# Patient Record
Sex: Male | Born: 1955 | Race: Black or African American | Hispanic: No | Smoking: Current every day smoker
Health system: Southern US, Community
[De-identification: ages and names within clinical notes are randomized; demographics above are authoritative.]

## PROBLEM LIST (undated history)

## (undated) DIAGNOSIS — E559 Vitamin D deficiency, unspecified: Secondary | ICD-10-CM

## (undated) DIAGNOSIS — K219 Gastro-esophageal reflux disease without esophagitis: Secondary | ICD-10-CM

## (undated) DIAGNOSIS — I700 Atherosclerosis of aorta: Secondary | ICD-10-CM

## (undated) DIAGNOSIS — J40 Bronchitis, not specified as acute or chronic: Secondary | ICD-10-CM

## (undated) DIAGNOSIS — E785 Hyperlipidemia, unspecified: Secondary | ICD-10-CM

## (undated) DIAGNOSIS — T7840XA Allergy, unspecified, initial encounter: Secondary | ICD-10-CM

## (undated) DIAGNOSIS — I10 Essential (primary) hypertension: Secondary | ICD-10-CM

## (undated) DIAGNOSIS — N400 Enlarged prostate without lower urinary tract symptoms: Secondary | ICD-10-CM

## (undated) HISTORY — DX: Hypercalcemia: E83.52

## (undated) HISTORY — DX: Benign prostatic hyperplasia without lower urinary tract symptoms: N40.0

## (undated) HISTORY — DX: Bronchitis, not specified as acute or chronic: J40

## (undated) HISTORY — DX: Vitamin D deficiency, unspecified: E55.9

## (undated) HISTORY — DX: Allergy, unspecified, initial encounter: T78.40XA

## (undated) HISTORY — DX: Hyperlipidemia, unspecified: E78.5

## (undated) HISTORY — DX: Essential (primary) hypertension: I10

## (undated) HISTORY — DX: Atherosclerosis of aorta: I70.0

## (undated) HISTORY — DX: Gastro-esophageal reflux disease without esophagitis: K21.9

---

## 2004-11-03 ENCOUNTER — Emergency Department (HOSPITAL_COMMUNITY): Admission: EM | Admit: 2004-11-03 | Discharge: 2004-11-03 | Payer: Self-pay | Admitting: Family Medicine

## 2006-01-23 ENCOUNTER — Emergency Department (HOSPITAL_COMMUNITY): Admission: EM | Admit: 2006-01-23 | Discharge: 2006-01-23 | Payer: Self-pay | Admitting: Emergency Medicine

## 2008-08-25 ENCOUNTER — Encounter: Admission: RE | Admit: 2008-08-25 | Discharge: 2008-08-25 | Payer: Self-pay | Admitting: Family Medicine

## 2010-04-10 ENCOUNTER — Emergency Department (HOSPITAL_COMMUNITY): Admission: EM | Admit: 2010-04-10 | Discharge: 2010-04-10 | Payer: Self-pay | Admitting: Family Medicine

## 2011-11-13 ENCOUNTER — Ambulatory Visit (HOSPITAL_COMMUNITY)
Admission: RE | Admit: 2011-11-13 | Discharge: 2011-11-13 | Disposition: A | Discharge status: Home / Self Care | Payer: 59 | Source: Ambulatory Visit | Attending: Internal Medicine | Admitting: Internal Medicine

## 2011-11-13 ENCOUNTER — Other Ambulatory Visit: Payer: Self-pay | Admitting: Internal Medicine

## 2011-11-13 DIAGNOSIS — R059 Cough, unspecified: Secondary | ICD-10-CM | POA: Insufficient documentation

## 2011-11-13 DIAGNOSIS — J40 Bronchitis, not specified as acute or chronic: Secondary | ICD-10-CM

## 2011-11-13 DIAGNOSIS — R05 Cough: Secondary | ICD-10-CM | POA: Insufficient documentation

## 2014-11-20 ENCOUNTER — Emergency Department (HOSPITAL_COMMUNITY)
Admission: EM | Admit: 2014-11-20 | Discharge: 2014-11-20 | Disposition: A | Discharge status: Home / Self Care | Payer: 59 | Source: Home / Self Care | Attending: Emergency Medicine | Admitting: Emergency Medicine

## 2014-11-20 ENCOUNTER — Encounter (HOSPITAL_COMMUNITY): Payer: Self-pay | Admitting: Emergency Medicine

## 2014-11-20 DIAGNOSIS — J209 Acute bronchitis, unspecified: Secondary | ICD-10-CM

## 2014-11-20 MED ORDER — AZITHROMYCIN 250 MG PO TABS: ORAL_TABLET | ORAL | Status: DC

## 2014-11-20 MED ORDER — CETIRIZINE HCL 10 MG PO TABS
10.0000 mg | ORAL_TABLET | Freq: Every day | ORAL | Status: DC
Start: 2014-11-20 — End: 2022-02-25

## 2014-11-20 MED ORDER — PREDNISONE 50 MG PO TABS: ORAL_TABLET | ORAL | Status: DC

## 2014-11-20 NOTE — Discharge Instructions (Signed)
You have bronchitis. Take prednisone and azithromycin as prescribed. Use Zyrtec daily for the next week or so. If you start the medications today, you will be able to enjoy a beer with the game on Saturday. Follow-up as needed.

## 2014-11-20 NOTE — ED Provider Notes (Signed)
CSN: [id number]     Arrival date & time 11/20/14  1027 History   First MD Initiated Contact with Patient 11/20/14 1117     Chief Complaint  Patient presents with  . Cough   (Consider location/radiation/quality/duration/timing/severity/associated sxs/prior Treatment) HPI He is a 59 year old man here for evaluation of cough. He states his symptoms started on Friday with cough, nasal congestion, fever. He took some Mucinex and some Alka-Seltzer which seemed to help his symptoms some. Yesterday, he went to work and at the end of the day his cough was worse.  He describes some wheezing at night. He also reports a decreased appetite. No nausea, vomiting, diarrhea. He does have some throat pain with coughing. No sore throat. No ear pain. He has had sick coworkers the last few weeks.  History reviewed. No pertinent past medical history. History reviewed. No pertinent past surgical history. History reviewed. No pertinent family history. History  Substance Use Topics  . Smoking status: Current Every Day Smoker -- 0.50 packs/day    Types: Cigarettes  . Smokeless tobacco: Not on file  . Alcohol Use: Yes    Review of Systems  Constitutional: Positive for fever and appetite change.  HENT: Positive for congestion. Negative for ear pain, rhinorrhea, sore throat and trouble swallowing.   Respiratory: Positive for cough and wheezing. Negative for shortness of breath.   Gastrointestinal: Negative for nausea, vomiting, abdominal pain and diarrhea.  Musculoskeletal: Negative for myalgias.  Neurological: Negative for headaches.    Allergies  Review of patient's allergies indicates no known allergies.  Home Medications   Prior to Admission medications   Medication Sig Start Date End Date Taking? Authorizing Provider  azithromycin (ZITHROMAX Z-PAK) 250 MG tablet Take 2 pills today, then 1 pill daily until gone. 11/20/14   Charm RingsErin J Stacyann Mcconaughy, MD  cetirizine (ZYRTEC) 10 MG tablet Take 1 tablet (10 mg total) by  mouth daily. 11/20/14   Charm RingsErin J Manie Bealer, MD  predniSONE (DELTASONE) 50 MG tablet Take 1 pill daily for 5 days. 11/20/14   Charm RingsErin J Dayten Juba, MD   BP 130/86 mmHg  Pulse 82  Temp(Src) 98.1 F (36.7 C) (Oral)  Resp 12  SpO2 100% Physical Exam  Constitutional: He is oriented to person, place, and time. He appears well-developed and well-nourished. No distress.  HENT:  Mouth/Throat: Posterior oropharyngeal erythema present. No oropharyngeal exudate.  Neck: Neck supple.  Cardiovascular: Normal rate, regular rhythm and normal heart sounds.   No murmur heard. Pulmonary/Chest: Effort normal. No respiratory distress. He has wheezes (rare). He has no rales. He exhibits no tenderness.  Lymphadenopathy:    He has no cervical adenopathy.  Neurological: He is alert and oriented to person, place, and time.    ED Course  Procedures (including critical care time) Labs Review Labs Reviewed - No data to display  Imaging Review No results found.   MDM   1. Acute bronchitis, unspecified organism    We'll treat with Zyrtec, prednisone, azithromycin. Discussed smoking cessation. Follow-up as needed.    Charm RingsErin J Nameer Summer, MD 11/20/14 1155

## 2014-11-20 NOTE — ED Notes (Signed)
Pt states that he has had a cough with congestion since Friday and states that when he coughs he has pain in his chest.

## 2021-01-13 ENCOUNTER — Encounter (HOSPITAL_COMMUNITY): Payer: Self-pay

## 2021-01-13 ENCOUNTER — Other Ambulatory Visit: Payer: Self-pay

## 2021-01-13 ENCOUNTER — Ambulatory Visit (INDEPENDENT_AMBULATORY_CARE_PROVIDER_SITE_OTHER): Payer: BLUE CROSS/BLUE SHIELD

## 2021-01-13 ENCOUNTER — Ambulatory Visit (HOSPITAL_COMMUNITY)
Admission: EM | Admit: 2021-01-13 | Discharge: 2021-01-13 | Disposition: A | Discharge status: Home / Self Care | Payer: BLUE CROSS/BLUE SHIELD | Attending: Emergency Medicine | Admitting: Emergency Medicine

## 2021-01-13 DIAGNOSIS — R059 Cough, unspecified: Secondary | ICD-10-CM

## 2021-01-13 DIAGNOSIS — J40 Bronchitis, not specified as acute or chronic: Secondary | ICD-10-CM | POA: Diagnosis not present

## 2021-01-13 DIAGNOSIS — R062 Wheezing: Secondary | ICD-10-CM

## 2021-01-13 MED ORDER — ALBUTEROL SULFATE HFA 108 (90 BASE) MCG/ACT IN AERS: 2.0000 | INHALATION_SPRAY | RESPIRATORY_TRACT | 0 refills | Status: DC | PRN

## 2021-01-13 MED ORDER — PREDNISONE 50 MG PO TABS: 50.0000 mg | ORAL_TABLET | Freq: Every day | ORAL | 0 refills | Status: DC

## 2021-01-13 NOTE — ED Provider Notes (Signed)
[name]  ____________________________________________  Time seen: Approximately 10:21 AM  I have reviewed the triage vital signs and the nursing notes.   HISTORY  Chief Complaint Cough and Wheezing    HPI Blake Ramirez is a 65 y.o. male who presents the emergency department complaining of cough, wheezing x2 weeks.  Patient states that he started with common cold symptoms with low-grade fever, nasal congestion, sore throat, cough.  Majority of symptoms have resolved the patient mains with a cough.  He states that initially there was some mucus production but he is been using Mucinex for same.  States that currently it is a dry cough with some intermittent audible wheezing.  He does have a history of bronchitis and states that this feels similar.  No frank shortness of breath.  No chest pain.  Residual URI symptoms of nasal congestion or sore throat.  No fevers.  No abdominal complaints at this time.         History reviewed. No pertinent past medical history.  There are no problems to display for this patient.   History reviewed. No pertinent surgical history.  Prior to Admission medications   Medication Sig Start Date End Date Taking? Authorizing Provider  albuterol (VENTOLIN HFA) 108 (90 Base) MCG/ACT inhaler Inhale 2 puffs into the lungs every 4 (four) hours as needed for wheezing or shortness of breath. 01/13/21  Yes Kenaz Olafson, Delorise Royals, PA-C  predniSONE (DELTASONE) 50 MG tablet Take 1 tablet (50 mg total) by mouth daily with breakfast. 01/13/21  Yes Sanvi Ehler, Delorise Royals, PA-C  azithromycin (ZITHROMAX Z-PAK) 250 MG tablet Take 2 pills today, then 1 pill daily until gone. 11/20/14   Charm Rings, MD  cetirizine (ZYRTEC) 10 MG tablet Take 1 tablet (10 mg total) by mouth daily. 11/20/14   Charm Rings, MD    Allergies Patient has no known allergies.  History reviewed. No pertinent family history.  Social History Social History   Tobacco Use  .  Smoking status: Current Every Day Smoker    Packs/day: 0.50    Types: Cigarettes  . Smokeless tobacco: Never Used  Substance Use Topics  . Alcohol use: Yes  . Drug use: No     Review of Systems  Constitutional: No fever/chills Eyes: No visual changes. No discharge ENT: No upper respiratory complaints. Cardiovascular: no chest pain. Respiratory: Positive cough.  Intermittent audible wheezing.  No SOB. Gastrointestinal: No abdominal pain.  No nausea, no vomiting.  No diarrhea.  No constipation. Musculoskeletal: Negative for musculoskeletal pain. Skin: Negative for rash, abrasions, lacerations, ecchymosis. Neurological: Negative for headaches, focal weakness or numbness.  10 System ROS otherwise negative.  ____________________________________________   PHYSICAL EXAM:  VITAL SIGNS: ED Triage Vitals  Enc Vitals Group     BP 01/13/21 1014 (!) 147/82     Pulse Rate 01/13/21 1014 68     Resp 01/13/21 1014 16     Temp 01/13/21 1014 98.1 F (36.7 C)     Temp Source 01/13/21 1014 Oral     SpO2 01/13/21 1014 98 %     Weight --      Height --      Head Circumference --      Peak Flow --      Pain Score 01/13/21 1013 0     Pain Loc --      Pain Edu? --      Excl. in GC? --      Constitutional: Alert and oriented. Well appearing  and in no acute distress. Eyes: Conjunctivae are normal. PERRL. EOMI. Head: Atraumatic. ENT:      Ears:       Nose: No congestion/rhinnorhea.      Mouth/Throat: Mucous membranes are moist.  Neck: No stridor.  Neck is supple full range of motion with no tenderness. Hematological/Lymphatic/Immunilogical: No cervical lymphadenopathy. Cardiovascular: Normal rate, regular rhythm. Normal S1 and S2.  Good peripheral circulation. Respiratory: Normal respiratory effort without tachypnea or retractions. Lungs faint scattered expiratory wheezes in the lower lung fields.  No expiratory wheezing.  No rales or rhonchi.Peri Jefferson air entry to the bases with no  decreased or absent breath sounds. Musculoskeletal: Full range of motion to all extremities. No gross deformities appreciated. Neurologic:  Normal speech and language. No gross focal neurologic deficits are appreciated.  Skin:  Skin is warm, dry and intact. No rash noted. Psychiatric: Mood and affect are normal. Speech and behavior are normal. Patient exhibits appropriate insight and judgement.   ____________________________________________   LABS (all labs ordered are listed, but only abnormal results are displayed)  Labs Reviewed - No data to display ____________________________________________  EKG   ____________________________________________  RADIOLOGY I personally viewed and evaluated these images as part of my medical decision making, as well as reviewing the written report by the radiologist.  ED Provider Interpretation: No consolidation concerning for pneumonia.  Peribronchial thickening consistent with bronchitis.  No acute cardiopulmonary findings  DG Chest 2 View  Result Date: 01/13/2021 CLINICAL DATA:  Cough for 2 weeks.  Wheezing EXAM: CHEST - 2 VIEW COMPARISON:  11/13/2011 FINDINGS: The heart size and mediastinal contours are within normal limits. Both lungs are clear. The visualized skeletal structures are unremarkable. IMPRESSION: No active cardiopulmonary disease. Electronically Signed   By: Signa Kell M.D.   On: 01/13/2021 11:16    ____________________________________________    PROCEDURES  Procedure(s) performed:    Procedures    Medications - No data to display   ____________________________________________   INITIAL IMPRESSION / ASSESSMENT AND PLAN / ED COURSE  Pertinent labs & imaging results that were available during my care of the patient were reviewed by me and considered in my medical decision making (see chart for details).  Review of the [place] CSRS was performed in accordance of the [facility] prior to dispensing any controlled drugs.            Patient's diagnosis is consistent with bronchitis.  Patient presented to the emergency department complaining of some cough, wheezing.  Patient has a history of bronchitis, states that he had a recent cold, and feels like he has similar symptoms to his previous episode of bronchitis.  Given the 2-week history of cough differential included viral illness, bronchitis, community-acquired pneumonia.  Chest x-ray revealed no evidence of consolidation concerning for pneumonia..  Mild peribronchial thickening concerning for bronchitis.  Patient will continue his over-the-counter cough medications, and I will prescribe short course of prednisone and albuterol for the patient..  Follow-up primary care as needed.  Return to the urgent care or emergency department if symptoms acutely worsen.    ____________________________________________  FINAL CLINICAL IMPRESSION(S) / ED DIAGNOSES  Final diagnoses:  Bronchitis      NEW MEDICATIONS STARTED DURING THIS VISIT:  ED Discharge Orders         Ordered    predniSONE (DELTASONE) 50 MG tablet  Daily with breakfast        01/13/21 1139    albuterol (VENTOLIN HFA) 108 (90 Base) MCG/ACT inhaler  Every 4 hours  PRN        01/13/21 1139              This chart was dictated using voice recognition software/Dragon. Despite best efforts to proofread, errors can occur which can change the meaning. Any change was purely unintentional.    Racheal Patches, PA-C 01/13/21 1141

## 2021-01-13 NOTE — ED Triage Notes (Signed)
Pt c/o cough and wheezing x 2 weeks. He states he has had Bronchitis before.

## 2021-11-11 IMAGING — DX DG CHEST 2V
2 series · 2 of 2 positions shown · non-contrast
Comparison: 11/13/2011

CLINICAL DATA: Cough for 2 weeks.  Wheezing

EXAM:
CHEST - 2 VIEW

[chest pa]
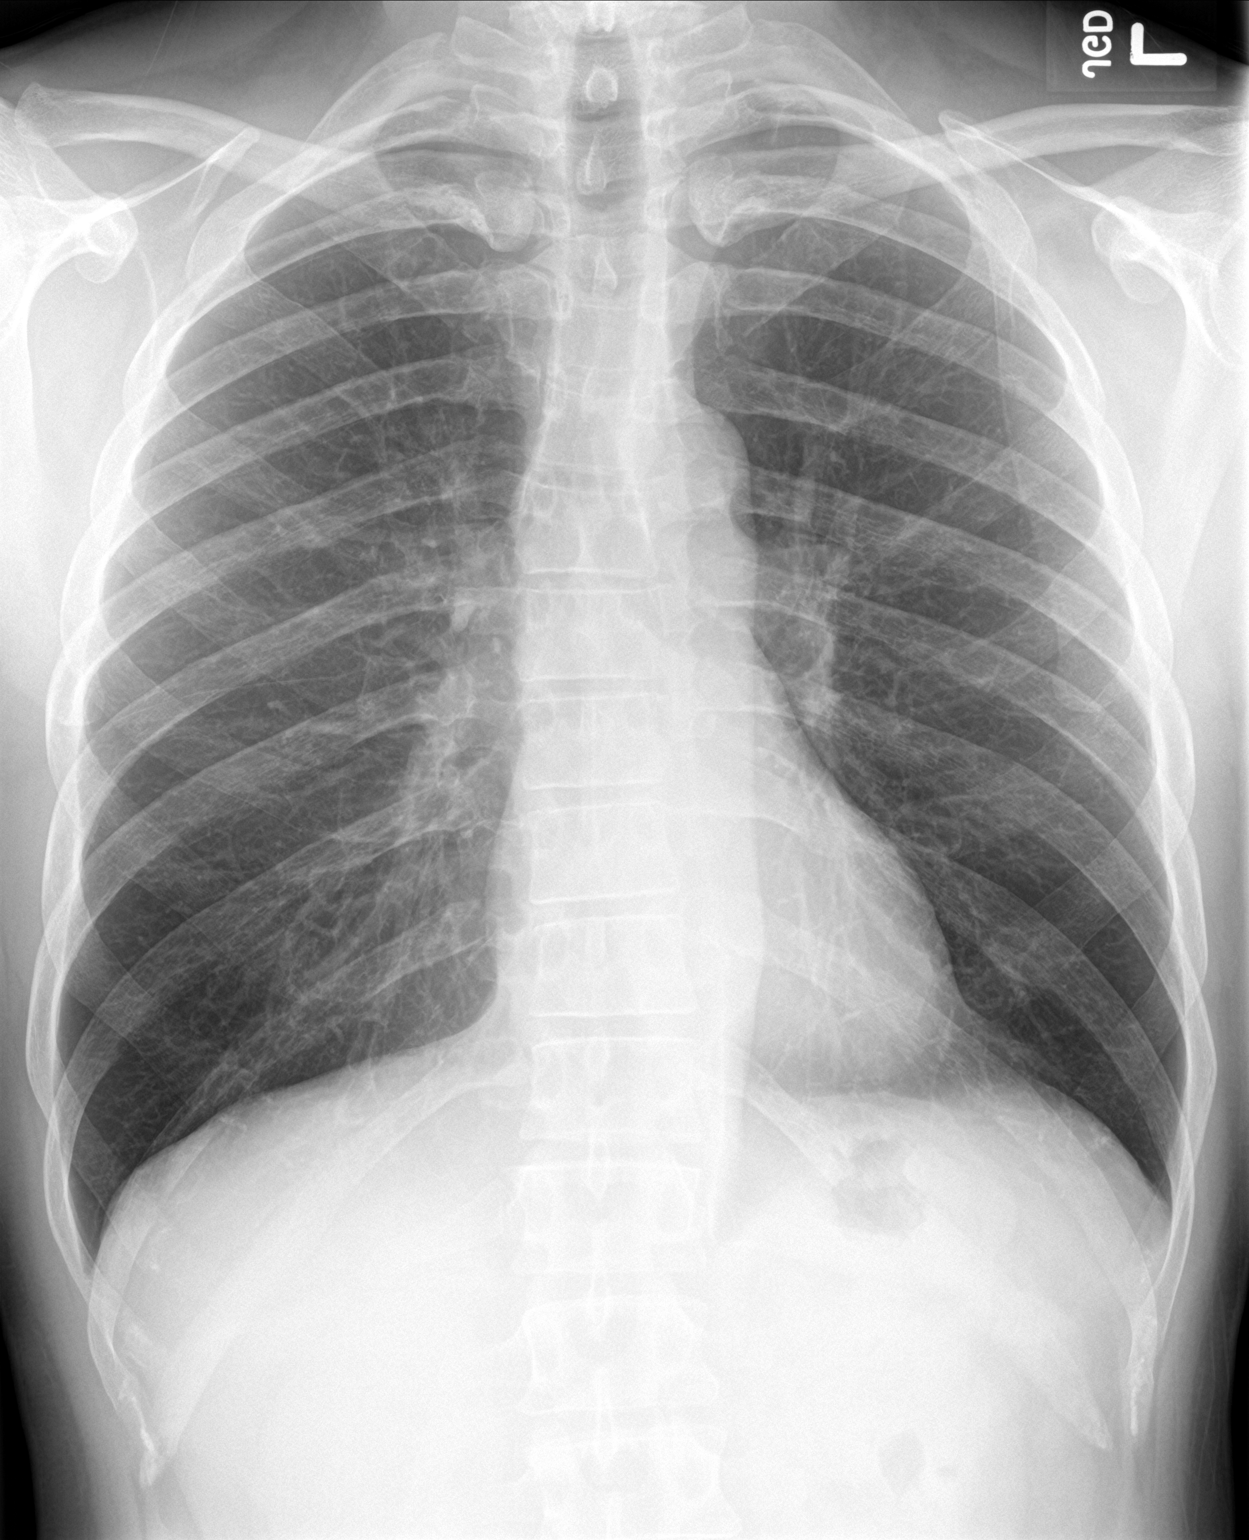

[chest lat]
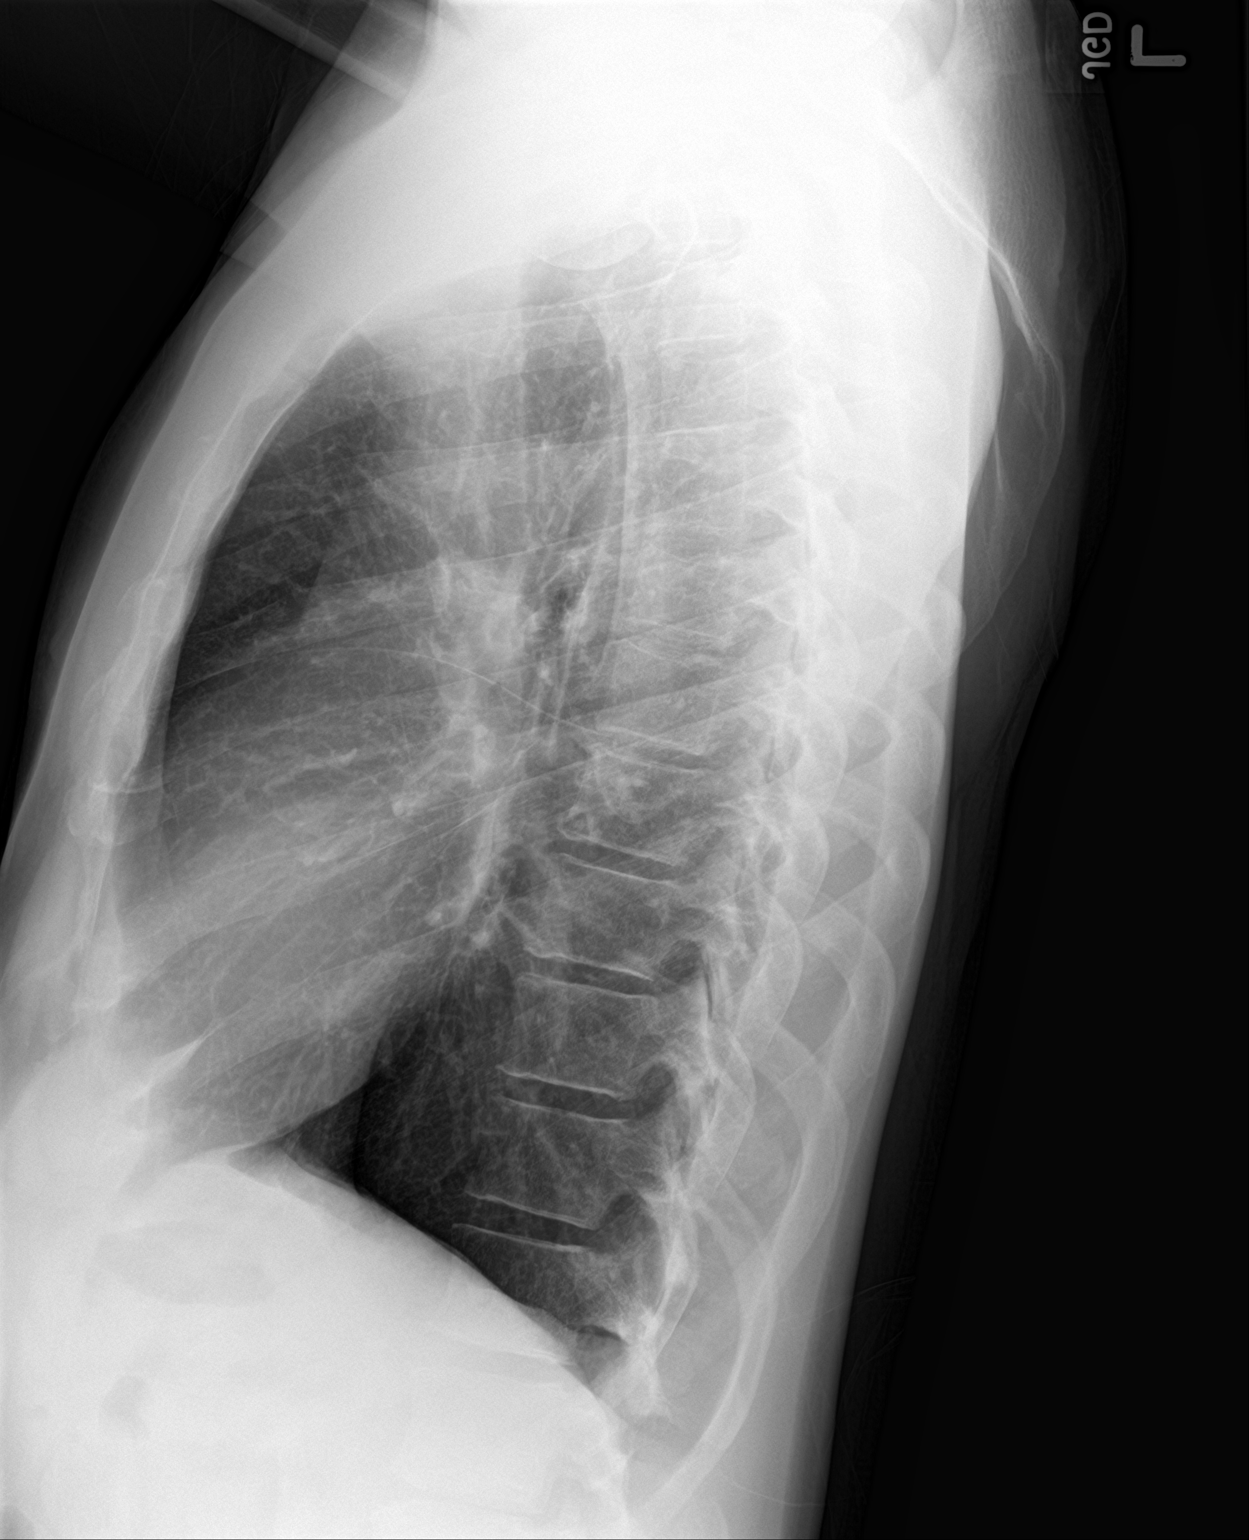

[2 of 2 positions shown; findings below may reference images not displayed]

FINDINGS: The heart size and mediastinal contours are within normal limits.
Both lungs are clear. The visualized skeletal structures are
unremarkable.
IMPRESSION: No active cardiopulmonary disease.

## 2022-01-09 ENCOUNTER — Ambulatory Visit: Payer: BLUE CROSS/BLUE SHIELD | Admitting: Nurse Practitioner

## 2022-01-15 ENCOUNTER — Ambulatory Visit: Payer: Medicare Other | Admitting: Emergency Medicine

## 2022-02-12 ENCOUNTER — Ambulatory Visit: Payer: BLUE CROSS/BLUE SHIELD | Admitting: Internal Medicine

## 2022-02-25 ENCOUNTER — Encounter: Payer: Self-pay | Admitting: Internal Medicine

## 2022-02-25 ENCOUNTER — Ambulatory Visit (INDEPENDENT_AMBULATORY_CARE_PROVIDER_SITE_OTHER): Payer: Medicare Other | Admitting: Internal Medicine

## 2022-02-25 VITALS — BP 152/78 | HR 79 | Temp 98.5°F | Resp 16 | Ht 71.0 in | Wt 164.0 lb

## 2022-02-25 DIAGNOSIS — J301 Allergic rhinitis due to pollen: Secondary | ICD-10-CM | POA: Diagnosis not present

## 2022-02-25 DIAGNOSIS — I10 Essential (primary) hypertension: Secondary | ICD-10-CM | POA: Diagnosis not present

## 2022-02-25 DIAGNOSIS — E785 Hyperlipidemia, unspecified: Secondary | ICD-10-CM | POA: Diagnosis not present

## 2022-02-25 DIAGNOSIS — G8929 Other chronic pain: Secondary | ICD-10-CM

## 2022-02-25 DIAGNOSIS — Z1211 Encounter for screening for malignant neoplasm of colon: Secondary | ICD-10-CM

## 2022-02-25 DIAGNOSIS — Z23 Encounter for immunization: Secondary | ICD-10-CM

## 2022-02-25 DIAGNOSIS — Z720 Tobacco use: Secondary | ICD-10-CM

## 2022-02-25 DIAGNOSIS — M25511 Pain in right shoulder: Secondary | ICD-10-CM

## 2022-02-25 DIAGNOSIS — N400 Enlarged prostate without lower urinary tract symptoms: Secondary | ICD-10-CM

## 2022-02-25 DIAGNOSIS — Z0001 Encounter for general adult medical examination with abnormal findings: Secondary | ICD-10-CM | POA: Diagnosis not present

## 2022-02-25 DIAGNOSIS — Z114 Encounter for screening for human immunodeficiency virus [HIV]: Secondary | ICD-10-CM

## 2022-02-25 DIAGNOSIS — Z1159 Encounter for screening for other viral diseases: Secondary | ICD-10-CM

## 2022-02-25 LAB — BASIC METABOLIC PANEL
BUN: 10 mg/dL (ref 6–23)
CO2: 31 mEq/L (ref 19–32)
Calcium: 10.8 mg/dL — ABNORMAL HIGH (ref 8.4–10.5)
Chloride: 100 mEq/L (ref 96–112)
Creatinine, Ser: 1.15 mg/dL (ref 0.40–1.50)
GFR: 66.93 mL/min (ref >60.00)
Glucose, Bld: 98 mg/dL (ref 70–99)
Potassium: 4.8 mEq/L (ref 3.5–5.1)
Sodium: 139 mEq/L (ref 135–145)

## 2022-02-25 LAB — CBC WITH DIFFERENTIAL/PLATELET
Basophils Absolute: 0.1 10*3/uL (ref 0.0–0.1)
Basophils Relative: 0.9 % (ref 0.0–3.0)
Eosinophils Absolute: 0.1 10*3/uL (ref 0.0–0.7)
Eosinophils Relative: 1.4 % (ref 0.0–5.0)
HCT: 42.9 % (ref 39.0–52.0)
Hemoglobin: 14.1 g/dL (ref 13.0–17.0)
Lymphocytes Relative: 35.4 % (ref 12.0–46.0)
Lymphs Abs: 2.2 10*3/uL (ref 0.7–4.0)
MCHC: 32.9 g/dL (ref 30.0–36.0)
MCV: 94.7 fl (ref 78.0–100.0)
Monocytes Absolute: 0.5 10*3/uL (ref 0.1–1.0)
Monocytes Relative: 8.2 % (ref 3.0–12.0)
Neutro Abs: 3.4 10*3/uL (ref 1.4–7.7)
Neutrophils Relative %: 54.1 % (ref 43.0–77.0)
Platelets: 239 10*3/uL (ref 150.0–400.0)
RBC: 4.53 Mil/uL (ref 4.22–5.81)
RDW: 13.6 % (ref 11.5–15.5)
WBC: 6.3 10*3/uL (ref 4.0–10.5)

## 2022-02-25 LAB — LIPID PANEL
Cholesterol: 230 mg/dL — ABNORMAL HIGH (ref 0–200)
HDL: 44.9 mg/dL (ref >39.00)
NonHDL: 185.3
Total CHOL/HDL Ratio: 5
Triglycerides: 222 mg/dL — ABNORMAL HIGH (ref 0.0–149.0)
VLDL: 44.4 mg/dL — ABNORMAL HIGH (ref 0.0–40.0)

## 2022-02-25 LAB — PSA: PSA: 1.01 ng/mL (ref 0.10–4.00)

## 2022-02-25 LAB — HEPATIC FUNCTION PANEL
ALT: 21 U/L (ref 0–53)
AST: 20 U/L (ref 0–37)
Albumin: 4.9 g/dL (ref 3.5–5.2)
Alkaline Phosphatase: 88 U/L (ref 39–117)
Bilirubin, Direct: 0 mg/dL (ref 0.0–0.3)
Total Bilirubin: 0.3 mg/dL (ref 0.2–1.2)
Total Protein: 7.8 g/dL (ref 6.0–8.3)

## 2022-02-25 LAB — TSH: TSH: 1.98 u[IU]/mL (ref 0.35–5.50)

## 2022-02-25 LAB — LDL CHOLESTEROL, DIRECT: Direct LDL: 160 mg/dL

## 2022-02-25 MED ORDER — CETIRIZINE HCL 10 MG PO TABS: 10.0000 mg | ORAL_TABLET | Freq: Every day | ORAL | 1 refills | Status: AC

## 2022-02-25 NOTE — Patient Instructions (Signed)
Health Maintenance, Male Adopting a healthy lifestyle and getting preventive care are important in promoting health and wellness. Ask your health care provider about: The right schedule for you to have regular tests and exams. Things you can do on your own to prevent diseases and keep yourself healthy. What should I know about diet, weight, and exercise? Eat a healthy diet  Eat a diet that includes plenty of vegetables, fruits, low-fat dairy products, and lean protein. Do not eat a lot of foods that are high in solid fats, added sugars, or sodium. Maintain a healthy weight Body mass index (BMI) is a measurement that can be used to identify possible weight problems. It estimates body fat based on height and weight. Your health care provider can help determine your BMI and help you achieve or maintain a healthy weight. Get regular exercise Get regular exercise. This is one of the most important things you can do for your health. Most adults should: Exercise for at least 150 minutes each week. The exercise should increase your heart rate and make you sweat (moderate-intensity exercise). Do strengthening exercises at least twice a week. This is in addition to the moderate-intensity exercise. Spend less time sitting. Even light physical activity can be beneficial. Watch cholesterol and blood lipids Have your blood tested for lipids and cholesterol at 65 years of age, then have this test every 5 years. You may need to have your cholesterol levels checked more often if: Your lipid or cholesterol levels are high. You are older than 65 years of age. You are at high risk for heart disease. What should I know about cancer screening? Many types of cancers can be detected early and may often be prevented. Depending on your health history and family history, you may need to have cancer screening at various ages. This may include screening for: Colorectal cancer. Prostate cancer. Skin cancer. Lung  cancer. What should I know about heart disease, diabetes, and high blood pressure? Blood pressure and heart disease High blood pressure causes heart disease and increases the risk of stroke. This is more likely to develop in people who have high blood pressure readings or are overweight. Talk with your health care provider about your target blood pressure readings. Have your blood pressure checked: Every 3-5 years if you are 18-39 years of age. Every year if you are 40 years old or older. If you are between the ages of 65 and 75 and are a current or former smoker, ask your health care provider if you should have a one-time screening for abdominal aortic aneurysm (AAA). Diabetes Have regular diabetes screenings. This checks your fasting blood sugar level. Have the screening done: Once every three years after age 45 if you are at a normal weight and have a low risk for diabetes. More often and at a younger age if you are overweight or have a high risk for diabetes. What should I know about preventing infection? Hepatitis B If you have a higher risk for hepatitis B, you should be screened for this virus. Talk with your health care provider to find out if you are at risk for hepatitis B infection. Hepatitis C Blood testing is recommended for: Everyone born from 1945 through 1965. Anyone with known risk factors for hepatitis C. Sexually transmitted infections (STIs) You should be screened each year for STIs, including gonorrhea and chlamydia, if: You are sexually active and are younger than 65 years of age. You are older than 65 years of age and your   health care provider tells you that you are at risk for this type of infection. Your sexual activity has changed since you were last screened, and you are at increased risk for chlamydia or gonorrhea. Ask your health care provider if you are at risk. Ask your health care provider about whether you are at high risk for HIV. Your health care provider  may recommend a prescription medicine to help prevent HIV infection. If you choose to take medicine to prevent HIV, you should first get tested for HIV. You should then be tested every 3 months for as long as you are taking the medicine. Follow these instructions at home: Alcohol use Do not drink alcohol if your health care provider tells you not to drink. If you drink alcohol: Limit how much you have to 0-2 drinks a day. Know how much alcohol is in your drink. In the [place] one drink equals one 12 oz bottle of beer (355 mL), one 5 oz glass of wine (148 mL), or one 1 oz glass of hard liquor (44 mL). Lifestyle Do not use any products that contain nicotine or tobacco. These products include cigarettes, chewing tobacco, and vaping devices, such as e-cigarettes. If you need help quitting, ask your health care provider. Do not use street drugs. Do not share needles. Ask your health care provider for help if you need support or information about quitting drugs. General instructions Schedule regular health, dental, and eye exams. Stay current with your vaccines. Tell your health care provider if: You often feel depressed. You have ever been abused or do not feel safe at home. Summary Adopting a healthy lifestyle and getting preventive care are important in promoting health and wellness. Follow your health care provider's instructions about healthy diet, exercising, and getting tested or screened for diseases. Follow your health care provider's instructions on monitoring your cholesterol and blood pressure. This information is not intended to replace advice given to you by your health care provider. Make sure you discuss any questions you have with your health care provider. Document Revised: 12/30/2020 Document Reviewed: 12/30/2020 Elsevier Patient Education  2023 [employer]  

## 2022-02-25 NOTE — Progress Notes (Signed)
Subjective:  Patient ID: Blake Ramirez, male    DOB: 08-13-1956  Age: 66 y.o. MRN: [id number]  CC: Annual Exam and Hypertension   HPI Blake Ramirez presents for a CPX and to establish.  He is active and denies chest pain, shortness of breath, diaphoresis, dizziness, lightheadedness, or edema.  History Blake Ramirez has a past medical history of Allergy, GERD (gastroesophageal reflux disease), Hyperlipidemia, and Hypertension.   He has no past surgical history on file.   His family history includes Arthritis in his brother and sister; Depression in his sister; Diabetes in his brother and sister; Early death in his brother; Hyperlipidemia in his brother and sister; Hypertension in his brother, mother, and sister; Intellectual disability in his sister; Prostate cancer in his father.He reports that he has been smoking cigarettes. He has a 25.00 pack-year smoking history. He has been exposed to tobacco smoke. He has never used smokeless tobacco. He reports current alcohol use of about 1.0 standard drink of alcohol per week. He reports that he does not use drugs.  Outpatient Medications Prior to Visit  Medication Sig Dispense Refill   albuterol (VENTOLIN HFA) 108 (90 Base) MCG/ACT inhaler Inhale 2 puffs into the lungs every 4 (four) hours as needed for wheezing or shortness of breath. 1 each 0   azithromycin (ZITHROMAX Z-PAK) 250 MG tablet Take 2 pills today, then 1 pill daily until gone. 6 tablet 0   cetirizine (ZYRTEC) 10 MG tablet Take 1 tablet (10 mg total) by mouth daily. 30 tablet 0   predniSONE (DELTASONE) 50 MG tablet Take 1 tablet (50 mg total) by mouth daily with breakfast. 5 tablet 0   No facility-administered medications prior to visit.    ROS Review of Systems  Constitutional: Negative.  Negative for diaphoresis and fatigue.  HENT: Negative.    Eyes: Negative.   Respiratory:  Negative for cough, chest tightness, shortness of breath and wheezing.   Cardiovascular:  Negative  for chest pain, palpitations and leg swelling.  Gastrointestinal:  Negative for abdominal pain, constipation, diarrhea, nausea and vomiting.  Endocrine: Negative.   Genitourinary: Negative.  Negative for difficulty urinating.  Musculoskeletal:  Positive for arthralgias. Negative for myalgias.       Chronic right shoulder pain.  Skin: Negative.   Neurological: Negative.  Negative for dizziness, weakness and light-headedness.  Hematological:  Negative for adenopathy. Does not bruise/bleed easily.  Psychiatric/Behavioral: Negative.      Objective:  BP (!) 152/78 (BP Location: Left Arm, Patient Position: Sitting, Cuff Size: Large)   Pulse 79   Temp 98.5 F (36.9 C) (Oral)   Resp 16   Ht 5\' 11"  (1.803 m)   Wt 164 lb (74.4 kg)   SpO2 97%   BMI 22.87 kg/m   Physical Exam Vitals reviewed.  Constitutional:      Appearance: He is not ill-appearing.  HENT:     Nose: Nose normal.     Mouth/Throat:     Mouth: Mucous membranes are moist.  Eyes:     General: No scleral icterus.    Conjunctiva/sclera: Conjunctivae normal.  Cardiovascular:     Rate and Rhythm: Normal rate and regular rhythm.     Pulses:          Carotid pulses are 1+ on the right side and 1+ on the left side.      Radial pulses are 1+ on the right side and 1+ on the left side.       Femoral pulses are 1+ on the  right side and 1+ on the left side.      Popliteal pulses are 1+ on the right side and 1+ on the left side.       Dorsalis pedis pulses are 1+ on the right side and 1+ on the left side.       Posterior tibial pulses are 1+ on the right side and 1+ on the left side.     Heart sounds: Normal heart sounds, S1 normal and S2 normal. No murmur heard.    No gallop.     Comments: EKG - NSR, 62 bpm ?LAE No LVH or Q waves No old for comparison Pulmonary:     Effort: Pulmonary effort is normal.     Breath sounds: No stridor. No wheezing, rhonchi or rales.  Abdominal:     General: Abdomen is flat. There is no  distension.     Palpations: There is no mass.     Tenderness: There is no abdominal tenderness. There is no guarding.     Hernia: No hernia is present. There is no hernia in the left inguinal area or right inguinal area.  Genitourinary:    Pubic Area: No rash.      Penis: Normal and circumcised.      Testes: Normal.     Epididymis:     Right: Normal.     Left: Normal.     Prostate: Enlarged. Not tender and no nodules present.     Rectum: Normal. Guaiac result negative. No mass, tenderness, anal fissure, external hemorrhoid or internal hemorrhoid. Normal anal tone.  Musculoskeletal:     Cervical back: Neck supple.     Right lower leg: No edema.     Left lower leg: No edema.  Lymphadenopathy:     Cervical: No cervical adenopathy.     Lower Body: No right inguinal adenopathy. No left inguinal adenopathy.  Skin:    General: Skin is warm and dry.  Neurological:     General: No focal deficit present.     Mental Status: He is alert.  Psychiatric:        Mood and Affect: Mood normal.        Behavior: Behavior normal.     Lab Results  Component Value Date   WBC 6.3 02/25/2022   HGB 14.1 02/25/2022   HCT 42.9 02/25/2022   PLT 239.0 02/25/2022   GLUCOSE 98 02/25/2022   CHOL 230 (H) 02/25/2022   TRIG 222.0 (H) 02/25/2022   HDL 44.90 02/25/2022   LDLDIRECT 160.0 02/25/2022   ALT 21 02/25/2022   AST 20 02/25/2022   NA 139 02/25/2022   K 4.8 02/25/2022   CL 100 02/25/2022   CREATININE 1.15 02/25/2022   BUN 10 02/25/2022   CO2 31 02/25/2022   TSH 1.98 02/25/2022   PSA 1.01 02/25/2022     Assessment & Plan:   Blake Ramirez was seen today for annual exam and hypertension.  Diagnoses and all orders for this visit:  Encounter for general adult medical examination with abnormal findings- Exam completed, labs reviewed, vaccines reviewed and updated, cancer screenings addressed, patient education was given.  Primary hypertension- His labs are negative for secondary causes or endorgan  damage.  His EKG is reassuring.  We will start amlodipine. -     Basic metabolic panel; Future -     TSH; Future -     CBC with Differential/Platelet; Future -     EKG 12-Lead -     CBC with Differential/Platelet -  TSH -     Basic metabolic panel -     amLODipine (NORVASC) 5 MG tablet; Take 1 tablet (5 mg total) by mouth daily.  Hyperlipidemia LDL goal <100- I have asked him to take a statin for cardiovascular risk reduction. -     Lipid panel; Future -     TSH; Future -     Hepatic function panel; Future -     Hepatic function panel -     TSH -     Lipid panel -     rosuvastatin (CRESTOR) 20 MG tablet; Take 1 tablet (20 mg total) by mouth daily.  Tobacco abuse -     Ambulatory Referral for Lung Cancer Scre  Seasonal allergic rhinitis due to pollen -     cetirizine (ZYRTEC) 10 MG tablet; Take 1 tablet (10 mg total) by mouth daily.  Need for hepatitis C screening test -     Hepatitis C antibody; Future -     Hepatitis C antibody  Screening for HIV (human immunodeficiency virus) -     HIV Antibody (routine testing w rflx); Future -     HIV Antibody (routine testing w rflx)  Benign prostatic hyperplasia without lower urinary tract symptoms -     PSA; Future -     Urinalysis, Routine w reflex microscopic; Future -     Urinalysis, Routine w reflex microscopic -     PSA  Screen for colon cancer -     Cologuard  Chronic right shoulder pain -     Ambulatory referral to Orthopedic Surgery  Hypercalcemia- I have asked him to return in 2 to 3 months to have this evaluated.  Need for prophylactic vaccination and inoculation against varicella -     Zoster Vaccine Adjuvanted [facility]) injection; Inject 0.5 mLs into the muscle once for 1 dose.  Need for prophylactic vaccination with combined diphtheria-tetanus-pertussis (DTP) vaccine -     Tdap (BOOSTRIX) 5-2.5-18.5 LF-MCG/0.5 injection; Inject 0.5 mLs into the muscle once for 1 dose.  Other orders -     LDL cholesterol,  direct   I have discontinued Blake Ramirez's azithromycin, predniSONE, and albuterol. I am also having him start on amLODipine, rosuvastatin, Shingrix, and Boostrix. Additionally, I am having him maintain his cetirizine.  Meds ordered this encounter  Medications   cetirizine (ZYRTEC) 10 MG tablet    Sig: Take 1 tablet (10 mg total) by mouth daily.    Dispense:  90 tablet    Refill:  1   amLODipine (NORVASC) 5 MG tablet    Sig: Take 1 tablet (5 mg total) by mouth daily.    Dispense:  90 tablet    Refill:  0   rosuvastatin (CRESTOR) 20 MG tablet    Sig: Take 1 tablet (20 mg total) by mouth daily.    Dispense:  90 tablet    Refill:  1   Zoster Vaccine Adjuvanted [facility]) injection    Sig: Inject 0.5 mLs into the muscle once for 1 dose.    Dispense:  0.5 mL    Refill:  1   Tdap (BOOSTRIX) 5-2.5-18.5 LF-MCG/0.5 injection    Sig: Inject 0.5 mLs into the muscle once for 1 dose.    Dispense:  0.5 mL    Refill:  0     Follow-up: Return in about 6 months (around 08/28/2022).  Sanda Linger, MD

## 2022-02-26 ENCOUNTER — Telehealth: Payer: Self-pay | Admitting: Internal Medicine

## 2022-02-26 LAB — URINALYSIS, ROUTINE W REFLEX MICROSCOPIC
Bilirubin Urine: NEGATIVE
Hgb urine dipstick: NEGATIVE
Ketones, ur: NEGATIVE
Leukocytes,Ua: NEGATIVE
Nitrite: NEGATIVE
RBC / HPF: NONE SEEN (ref 0–?)
Specific Gravity, Urine: <=1.005 — AB (ref 1.000–1.030)
Total Protein, Urine: NEGATIVE
Urine Glucose: NEGATIVE
Urobilinogen, UA: 0.2 (ref 0.0–1.0)
pH: 6 (ref 5.0–8.0)

## 2022-02-26 LAB — HIV ANTIBODY (ROUTINE TESTING W REFLEX): HIV 1&2 Ab, 4th Generation: NONREACTIVE

## 2022-02-26 LAB — HEPATITIS C ANTIBODY: Hepatitis C Ab: NONREACTIVE

## 2022-02-26 MED ORDER — AMLODIPINE BESYLATE 5 MG PO TABS: 5.0000 mg | ORAL_TABLET | Freq: Every day | ORAL | 0 refills | Status: DC

## 2022-02-26 MED ORDER — ROSUVASTATIN CALCIUM 20 MG PO TABS: 20.0000 mg | ORAL_TABLET | Freq: Every day | ORAL | 1 refills | Status: DC

## 2022-02-26 NOTE — Telephone Encounter (Signed)
Pt called back and stated he got the rosuvastatin. Pt is now requesting an rx for his elevated calcium levels. Pt stated he will try to cut back on dairy products but he would like an rx to assist him if there is one that can prescribed.    Please advise.

## 2022-02-26 NOTE — Telephone Encounter (Signed)
Pt is requesting rx for astatin. Astatin was recommened to pt per lab results notes from Dr. Yetta Barre on 02/25/25. Pt also stated lab results showed elevated calcium levels and pt would like to know if there is an rx that he can be prescribed for that.    Please advise   CB: [phone]

## 2022-02-27 ENCOUNTER — Encounter: Payer: Self-pay | Admitting: Internal Medicine

## 2022-02-27 MED ORDER — BOOSTRIX 5-2.5-18.5 LF-MCG/0.5 IM SUSP: 0.5000 mL | Freq: Once | INTRAMUSCULAR | 0 refills | Status: AC

## 2022-02-27 MED ORDER — SHINGRIX 50 MCG/0.5ML IM SUSR: 0.5000 mL | Freq: Once | INTRAMUSCULAR | 1 refills | Status: AC

## 2022-03-15 LAB — COLOGUARD: COLOGUARD: NEGATIVE

## 2022-04-16 ENCOUNTER — Other Ambulatory Visit: Payer: Self-pay | Admitting: *Deleted

## 2022-04-16 DIAGNOSIS — F17210 Nicotine dependence, cigarettes, uncomplicated: Secondary | ICD-10-CM

## 2022-04-16 DIAGNOSIS — Z122 Encounter for screening for malignant neoplasm of respiratory organs: Secondary | ICD-10-CM

## 2022-04-16 DIAGNOSIS — Z87891 Personal history of nicotine dependence: Secondary | ICD-10-CM

## 2022-05-04 ENCOUNTER — Encounter: Payer: Self-pay | Admitting: Internal Medicine

## 2022-05-04 ENCOUNTER — Ambulatory Visit (INDEPENDENT_AMBULATORY_CARE_PROVIDER_SITE_OTHER): Payer: Medicare Other | Admitting: Internal Medicine

## 2022-05-04 DIAGNOSIS — Z23 Encounter for immunization: Secondary | ICD-10-CM

## 2022-05-04 DIAGNOSIS — I10 Essential (primary) hypertension: Secondary | ICD-10-CM | POA: Diagnosis not present

## 2022-05-04 LAB — BASIC METABOLIC PANEL
BUN: 11 mg/dL (ref 6–23)
CO2: 28 mEq/L (ref 19–32)
Calcium: 9.7 mg/dL (ref 8.4–10.5)
Chloride: 102 mEq/L (ref 96–112)
Creatinine, Ser: 1.14 mg/dL (ref 0.40–1.50)
GFR: 67.55 mL/min (ref >60.00)
Glucose, Bld: 92 mg/dL (ref 70–99)
Potassium: 4.8 mEq/L (ref 3.5–5.1)
Sodium: 137 mEq/L (ref 135–145)

## 2022-05-04 LAB — VITAMIN D 25 HYDROXY (VIT D DEFICIENCY, FRACTURES): VITD: 27.28 ng/mL — ABNORMAL LOW (ref 30.00–100.00)

## 2022-05-04 MED ORDER — SHINGRIX 50 MCG/0.5ML IM SUSR: 0.5000 mL | Freq: Once | INTRAMUSCULAR | 1 refills | Status: AC

## 2022-05-04 MED ORDER — BOOSTRIX 5-2.5-18.5 LF-MCG/0.5 IM SUSP: 0.5000 mL | Freq: Once | INTRAMUSCULAR | 0 refills | Status: AC

## 2022-05-04 NOTE — Patient Instructions (Signed)
Hypertension, Adult High blood pressure (hypertension) is when the force of blood pumping through the arteries is too strong. The arteries are the blood vessels that carry blood from the heart throughout the body. Hypertension forces the heart to work harder to pump blood and may cause arteries to become narrow or stiff. Untreated or uncontrolled hypertension can lead to a heart attack, heart failure, a stroke, kidney disease, and other problems. A blood pressure reading consists of a higher number over a lower number. Ideally, your blood pressure should be below 120/80. The first ("top") number is called the systolic pressure. It is a measure of the pressure in your arteries as your heart beats. The second ("bottom") number is called the diastolic pressure. It is a measure of the pressure in your arteries as the heart relaxes. What are the causes? The exact cause of this condition is not known. There are some conditions that result in high blood pressure. What increases the risk? Certain factors may make you more likely to develop high blood pressure. Some of these risk factors are under your control, including: Smoking. Not getting enough exercise or physical activity. Being overweight. Having too much fat, sugar, calories, or salt (sodium) in your diet. Drinking too much alcohol. Other risk factors include: Having a personal history of heart disease, diabetes, high cholesterol, or kidney disease. Stress. Having a family history of high blood pressure and high cholesterol. Having obstructive sleep apnea. Age. The risk increases with age. What are the signs or symptoms? High blood pressure may not cause symptoms. Very high blood pressure (hypertensive crisis) may cause: Headache. Fast or irregular heartbeats (palpitations). Shortness of breath. Nosebleed. Nausea and vomiting. Vision changes. Severe chest pain, dizziness, and seizures. How is this diagnosed? This condition is diagnosed by  measuring your blood pressure while you are seated, with your arm resting on a flat surface, your legs uncrossed, and your feet flat on the floor. The cuff of the blood pressure monitor will be placed directly against the skin of your upper arm at the level of your heart. Blood pressure should be measured at least twice using the same arm. Certain conditions can cause a difference in blood pressure between your right and left arms. If you have a high blood pressure reading during one visit or you have normal blood pressure with other risk factors, you may be asked to: Return on a different day to have your blood pressure checked again. Monitor your blood pressure at home for 1 week or longer. If you are diagnosed with hypertension, you may have other blood or imaging tests to help your health care provider understand your overall risk for other conditions. How is this treated? This condition is treated by making healthy lifestyle changes, such as eating healthy foods, exercising more, and reducing your alcohol intake. You may be referred for counseling on a healthy diet and physical activity. Your health care provider may prescribe medicine if lifestyle changes are not enough to get your blood pressure under control and if: Your systolic blood pressure is above 130. Your diastolic blood pressure is above 80. Your personal target blood pressure may vary depending on your medical conditions, your age, and other factors. Follow these instructions at home: Eating and drinking  Eat a diet that is high in fiber and potassium, and low in sodium, added sugar, and fat. An example of this eating plan is called the DASH diet. DASH stands for Dietary Approaches to Stop Hypertension. To eat this way: Eat   plenty of fresh fruits and vegetables. Try to fill one half of your plate at each meal with fruits and vegetables. Eat whole grains, such as whole-wheat pasta, brown rice, or whole-grain bread. Fill about one  fourth of your plate with whole grains. Eat or drink low-fat dairy products, such as skim milk or low-fat yogurt. Avoid fatty cuts of meat, processed or cured meats, and poultry with skin. Fill about one fourth of your plate with lean proteins, such as fish, chicken without skin, beans, eggs, or tofu. Avoid pre-made and processed foods. These tend to be higher in sodium, added sugar, and fat. Reduce your daily sodium intake. Many people with hypertension should eat less than 1,500 mg of sodium a day. Do not drink alcohol if: Your health care provider tells you not to drink. You are pregnant, may be pregnant, or are planning to become pregnant. If you drink alcohol: Limit how much you have to: 0-1 drink a day for women. 0-2 drinks a day for men. Know how much alcohol is in your drink. In the [place] one drink equals one 12 oz bottle of beer (355 mL), one 5 oz glass of wine (148 mL), or one 1 oz glass of hard liquor (44 mL). Lifestyle  Work with your health care provider to maintain a healthy body weight or to lose weight. Ask what an ideal weight is for you. Get at least 30 minutes of exercise that causes your heart to beat faster (aerobic exercise) most days of the week. Activities may include walking, swimming, or biking. Include exercise to strengthen your muscles (resistance exercise), such as Pilates or lifting weights, as part of your weekly exercise routine. Try to do these types of exercises for 30 minutes at least 3 days a week. Do not use any products that contain nicotine or tobacco. These products include cigarettes, chewing tobacco, and vaping devices, such as e-cigarettes. If you need help quitting, ask your health care provider. Monitor your blood pressure at home as told by your health care provider. Keep all follow-up visits. This is important. Medicines Take over-the-counter and prescription medicines only as told by your health care provider. Follow directions carefully. Blood  pressure medicines must be taken as prescribed. Do not skip doses of blood pressure medicine. Doing this puts you at risk for problems and can make the medicine less effective. Ask your health care provider about side effects or reactions to medicines that you should watch for. Contact a health care provider if you: Think you are having a reaction to a medicine you are taking. Have headaches that keep coming back (recurring). Feel dizzy. Have swelling in your ankles. Have trouble with your vision. Get help right away if you: Develop a severe headache or confusion. Have unusual weakness or numbness. Feel faint. Have severe pain in your chest or abdomen. Vomit repeatedly. Have trouble breathing. These symptoms may be an emergency. Get help right away. Call 911. Do not wait to see if the symptoms will go away. Do not drive yourself to the hospital. Summary Hypertension is when the force of blood pumping through your arteries is too strong. If this condition is not controlled, it may put you at risk for serious complications. Your personal target blood pressure may vary depending on your medical conditions, your age, and other factors. For most people, a normal blood pressure is less than 120/80. Hypertension is treated with lifestyle changes, medicines, or a combination of both. Lifestyle changes include losing weight, eating a healthy,   low-sodium diet, exercising more, and limiting alcohol. This information is not intended to replace advice given to you by your health care provider. Make sure you discuss any questions you have with your health care provider. Document Revised: 06/17/2021 Document Reviewed: 06/17/2021 Elsevier Patient Education  2023 [employer]  

## 2022-05-04 NOTE — Progress Notes (Unsigned)
   Subjective:  Patient ID: Blake Ramirez, male    DOB: 04/11/1956  Age: 66 y.o. MRN: [id number]  CC: No chief complaint on file.   HPI Izayiah Tibbitts presents for ***  Outpatient Medications Prior to Visit  Medication Sig Dispense Refill   amLODipine (NORVASC) 5 MG tablet Take 1 tablet (5 mg total) by mouth daily. 90 tablet 0   cetirizine (ZYRTEC) 10 MG tablet Take 1 tablet (10 mg total) by mouth daily. 90 tablet 1   rosuvastatin (CRESTOR) 20 MG tablet Take 1 tablet (20 mg total) by mouth daily. 90 tablet 1   No facility-administered medications prior to visit.    ROS Review of Systems  Objective:  BP (!) 142/78 (BP Location: Right Arm, Patient Position: Sitting, Cuff Size: Large)   Pulse 93   Temp 98.2 F (36.8 C) (Oral)   Resp 16   Ht 5\' 11"  (1.803 m)   Wt 160 lb (72.6 kg)   SpO2 92%   BMI 22.32 kg/m   BP Readings from Last 3 Encounters:  05/04/22 (!) 142/78  02/25/22 (!) 152/78  01/13/21 (!) 147/82    Wt Readings from Last 3 Encounters:  05/04/22 160 lb (72.6 kg)  02/25/22 164 lb (74.4 kg)    Physical Exam  Lab Results  Component Value Date   WBC 6.3 02/25/2022   HGB 14.1 02/25/2022   HCT 42.9 02/25/2022   PLT 239.0 02/25/2022   GLUCOSE 98 02/25/2022   CHOL 230 (H) 02/25/2022   TRIG 222.0 (H) 02/25/2022   HDL 44.90 02/25/2022   LDLDIRECT 160.0 02/25/2022   ALT 21 02/25/2022   AST 20 02/25/2022   NA 139 02/25/2022   K 4.8 02/25/2022   CL 100 02/25/2022   CREATININE 1.15 02/25/2022   BUN 10 02/25/2022   CO2 31 02/25/2022   TSH 1.98 02/25/2022   PSA 1.01 02/25/2022    DG Chest 2 View  Result Date: 01/13/2021 CLINICAL DATA:  Cough for 2 weeks.  Wheezing EXAM: CHEST - 2 VIEW COMPARISON:  11/13/2011 FINDINGS: The heart size and mediastinal contours are within normal limits. Both lungs are clear. The visualized skeletal structures are unremarkable. IMPRESSION: No active cardiopulmonary disease. Electronically Signed   By: 11/15/2011 M.D.    On: 01/13/2021 11:16    Assessment & Plan:   Diagnoses and all orders for this visit:  Hypercalcemia -     Basic metabolic panel; Future -     VITAMIN D 25 Hydroxy (Vit-D Deficiency, Fractures); Future -     PTH, intact and calcium; Future  Primary hypertension -     Basic metabolic panel; Future   I am having 01/15/2021 maintain his cetirizine, amLODipine, and rosuvastatin.  No orders of the defined types were placed in this encounter.    Follow-up: No follow-ups on file.  [name], MD

## 2022-05-08 LAB — PTH, INTACT AND CALCIUM
Calcium: 6.2 mg/dL — ABNORMAL LOW (ref 8.6–10.3)
PTH: 37 pg/mL (ref 16–77)

## 2022-05-10 ENCOUNTER — Other Ambulatory Visit: Payer: Self-pay | Admitting: Internal Medicine

## 2022-05-10 DIAGNOSIS — E559 Vitamin D deficiency, unspecified: Secondary | ICD-10-CM

## 2022-05-10 MED ORDER — CHOLECALCIFEROL 50 MCG (2000 UT) PO TABS: 1.0000 | ORAL_TABLET | Freq: Every day | ORAL | 1 refills | Status: DC

## 2022-05-12 ENCOUNTER — Ambulatory Visit (INDEPENDENT_AMBULATORY_CARE_PROVIDER_SITE_OTHER): Payer: Medicare Other | Admitting: Acute Care

## 2022-05-12 ENCOUNTER — Encounter: Payer: Self-pay | Admitting: Acute Care

## 2022-05-12 DIAGNOSIS — F17210 Nicotine dependence, cigarettes, uncomplicated: Secondary | ICD-10-CM

## 2022-05-12 NOTE — Patient Instructions (Signed)

## 2022-05-12 NOTE — Progress Notes (Signed)
Virtual Visit via Telephone Note  I connected with Blake Ramirez on 05/12/22 at  2:00 PM EDT by telephone and verified that I am speaking with the correct person using two identifiers.  Location: Patient:  At home Provider:  [address], [place], [place], [address]    I discussed the limitations, risks, security and privacy concerns of performing an evaluation and management service by telephone and the availability of in person appointments. I also discussed with the patient that there may be a patient responsible charge related to this service. The patient expressed understanding and agreed to proceed.   Shared Decision Making Visit Lung Cancer Screening Program [phone])   Eligibility: Age 66 y.o. Pack Years Smoking History Calculation 20 pack year smoking history (# packs/per year x # years smoked) Recent History of coughing up blood  no Unexplained weight loss? no ( >Than 15 pounds within the last 6 months ) Prior History Lung / other cancer no (Diagnosis within the last 5 years already requiring surveillance chest CT Scans). Smoking Status Current Smoker Former Smokers: Years since quit:  NA  Quit Date:  NA  Visit Components: Discussion included one or more decision making aids. yes Discussion included risk/benefits of screening. yes Discussion included potential follow up diagnostic testing for abnormal scans. yes Discussion included meaning and risk of over diagnosis. yes Discussion included meaning and risk of False Positives. yes Discussion included meaning of total radiation exposure. yes  Counseling Included: Importance of adherence to annual lung cancer LDCT screening. yes Impact of comorbidities on ability to participate in the program. yes Ability and willingness to under diagnostic treatment. yes  Smoking Cessation Counseling: Current Smokers:  Discussed importance of smoking cessation. yes Information about tobacco cessation classes and  interventions provided to patient. yes Patient provided with "ticket" for LDCT Scan. yes Symptomatic Patient. no  Counseling NA Diagnosis Code: Tobacco Use Z72.0 Asymptomatic Patient yes  Counseling (Intermediate counseling: > three minutes counseling) [id number] Former Smokers:  Discussed the importance of maintaining cigarette abstinence. yes Diagnosis Code: Personal History of Nicotine Dependence. C62.376 Information about tobacco cessation classes and interventions provided to patient. Yes Patient provided with "ticket" for LDCT Scan. yes Written Order for Lung Cancer Screening with LDCT placed in Epic. Yes (CT Chest Lung Cancer Screening Low Dose W/O CM) [id number] Z12.2-Screening of respiratory organs Z87.891-Personal history of nicotine dependence  I have spent 25 minutes of face to face/ virtual visit   time with  Mr. Blank discussing the risks and benefits of lung cancer screening. We viewed / discussed a power point together that explained in detail the above noted topics. We paused at intervals to allow for questions to be asked and answered to ensure understanding.We discussed that the single most powerful action that he can take to decrease his risk of developing lung cancer is to quit smoking. We discussed whether or not he is ready to commit to setting a quit date. We discussed options for tools to aid in quitting smoking including nicotine replacement therapy, non-nicotine medications, support groups, Quit Smart classes, and behavior modification. We discussed that often times setting smaller, more achievable goals, such as eliminating 1 cigarette a day for a week and then 2 cigarettes a day for a week can be helpful in slowly decreasing the number of cigarettes smoked. This allows for a sense of accomplishment as well as providing a clinical benefit. I provided  him  with smoking cessation  information  with contact information for community resources, classes,  free nicotine  replacement therapy, and access to mobile apps, text messaging, and on-line smoking cessation help. I have also provided  him  the office contact information in the event he needs to contact me, or the screening staff. We discussed the time and location of the scan, and that either Doroteo Glassman RN, Joella Prince, RN  or I will call / send a letter with the results within 24-72 hours of receiving them. The patient verbalized understanding of all of  the above and had no further questions upon leaving the office. They have my contact information in the event they have any further questions.  I spent 3 minutes counseling on smoking cessation and the health risks of continued tobacco abuse.  I explained to the patient that there has been a high incidence of coronary artery disease noted on these exams. I explained that this is a non-gated exam therefore degree or severity cannot be determined. This patient is on statin therapy. I have asked the patient to follow-up with their PCP regarding any incidental finding of coronary artery disease and management with diet or medication as their PCP  feels is clinically indicated. The patient verbalized understanding of the above and had no further questions upon completion of the visit.     Down to a quarter of a pack per day. Actively working on smoking cessation .   Pt had a pneumonia vaccine 1 week ago. We will delay scan x 1 month.    Magdalen Spatz, NP 05/12/2022

## 2022-05-13 ENCOUNTER — Ambulatory Visit (HOSPITAL_COMMUNITY): Payer: Medicare Other

## 2022-05-13 ENCOUNTER — Encounter (HOSPITAL_COMMUNITY): Payer: Self-pay

## 2022-05-17 ENCOUNTER — Other Ambulatory Visit: Payer: Self-pay | Admitting: Internal Medicine

## 2022-05-17 DIAGNOSIS — I10 Essential (primary) hypertension: Secondary | ICD-10-CM

## 2022-06-10 ENCOUNTER — Ambulatory Visit (HOSPITAL_COMMUNITY)
Admission: RE | Admit: 2022-06-10 | Discharge: 2022-06-10 | Disposition: A | Discharge status: Home / Self Care | Payer: Medicare Other | Source: Ambulatory Visit | Attending: Acute Care | Admitting: Acute Care

## 2022-06-10 DIAGNOSIS — Z122 Encounter for screening for malignant neoplasm of respiratory organs: Secondary | ICD-10-CM | POA: Diagnosis not present

## 2022-06-10 DIAGNOSIS — F17210 Nicotine dependence, cigarettes, uncomplicated: Secondary | ICD-10-CM | POA: Diagnosis not present

## 2022-06-10 DIAGNOSIS — Z87891 Personal history of nicotine dependence: Secondary | ICD-10-CM | POA: Insufficient documentation

## 2022-06-15 ENCOUNTER — Other Ambulatory Visit: Payer: Self-pay

## 2022-06-15 DIAGNOSIS — Z122 Encounter for screening for malignant neoplasm of respiratory organs: Secondary | ICD-10-CM

## 2022-06-15 DIAGNOSIS — Z87891 Personal history of nicotine dependence: Secondary | ICD-10-CM

## 2022-06-15 DIAGNOSIS — F17210 Nicotine dependence, cigarettes, uncomplicated: Secondary | ICD-10-CM

## 2022-08-19 ENCOUNTER — Other Ambulatory Visit: Payer: Self-pay | Admitting: Internal Medicine

## 2022-08-19 DIAGNOSIS — E785 Hyperlipidemia, unspecified: Secondary | ICD-10-CM

## 2022-08-20 ENCOUNTER — Other Ambulatory Visit: Payer: Self-pay | Admitting: Internal Medicine

## 2022-08-20 DIAGNOSIS — I10 Essential (primary) hypertension: Secondary | ICD-10-CM

## 2022-09-01 ENCOUNTER — Ambulatory Visit: Payer: Medicare Other | Admitting: Internal Medicine

## 2022-11-03 ENCOUNTER — Encounter: Payer: Self-pay | Admitting: Internal Medicine

## 2022-11-03 ENCOUNTER — Ambulatory Visit (INDEPENDENT_AMBULATORY_CARE_PROVIDER_SITE_OTHER): Payer: Medicare Other | Admitting: Internal Medicine

## 2022-11-03 VITALS — BP 132/76 | HR 95 | Temp 98.2°F | Resp 16 | Ht 71.0 in | Wt 167.0 lb

## 2022-11-03 DIAGNOSIS — Z23 Encounter for immunization: Secondary | ICD-10-CM | POA: Insufficient documentation

## 2022-11-03 DIAGNOSIS — R6882 Decreased libido: Secondary | ICD-10-CM | POA: Insufficient documentation

## 2022-11-03 DIAGNOSIS — E785 Hyperlipidemia, unspecified: Secondary | ICD-10-CM

## 2022-11-03 DIAGNOSIS — I700 Atherosclerosis of aorta: Secondary | ICD-10-CM | POA: Insufficient documentation

## 2022-11-03 DIAGNOSIS — N529 Male erectile dysfunction, unspecified: Secondary | ICD-10-CM | POA: Insufficient documentation

## 2022-11-03 DIAGNOSIS — I10 Essential (primary) hypertension: Secondary | ICD-10-CM

## 2022-11-03 LAB — CBC WITH DIFFERENTIAL/PLATELET
Basophils Absolute: 0.1 10*3/uL (ref 0.0–0.1)
Basophils Relative: 1 % (ref 0.0–3.0)
Eosinophils Absolute: 0.1 10*3/uL (ref 0.0–0.7)
Eosinophils Relative: 2.2 % (ref 0.0–5.0)
HCT: 42.1 % (ref 39.0–52.0)
Hemoglobin: 14.4 g/dL (ref 13.0–17.0)
Lymphocytes Relative: 37.1 % (ref 12.0–46.0)
Lymphs Abs: 2.3 10*3/uL (ref 0.7–4.0)
MCHC: 34.3 g/dL (ref 30.0–36.0)
MCV: 93.1 fl (ref 78.0–100.0)
Monocytes Absolute: 0.5 10*3/uL (ref 0.1–1.0)
Monocytes Relative: 8.5 % (ref 3.0–12.0)
Neutro Abs: 3.2 10*3/uL (ref 1.4–7.7)
Neutrophils Relative %: 51.2 % (ref 43.0–77.0)
Platelets: 241 10*3/uL (ref 150.0–400.0)
RBC: 4.52 Mil/uL (ref 4.22–5.81)
RDW: 13.7 % (ref 11.5–15.5)
WBC: 6.2 10*3/uL (ref 4.0–10.5)

## 2022-11-03 LAB — BASIC METABOLIC PANEL
BUN: 16 mg/dL (ref 6–23)
CO2: 31 mEq/L (ref 19–32)
Calcium: 10.2 mg/dL (ref 8.4–10.5)
Chloride: 100 mEq/L (ref 96–112)
Creatinine, Ser: 1.15 mg/dL (ref 0.40–1.50)
GFR: 66.61 mL/min (ref >60.00)
Glucose, Bld: 85 mg/dL (ref 70–99)
Potassium: 4.9 mEq/L (ref 3.5–5.1)
Sodium: 138 mEq/L (ref 135–145)

## 2022-11-03 MED ORDER — SHINGRIX 50 MCG/0.5ML IM SUSR: 0.5000 mL | Freq: Once | INTRAMUSCULAR | 1 refills | Status: AC

## 2022-11-03 MED ORDER — BOOSTRIX 5-2.5-18.5 LF-MCG/0.5 IM SUSP: 0.5000 mL | Freq: Once | INTRAMUSCULAR | 0 refills | Status: AC

## 2022-11-03 NOTE — Progress Notes (Unsigned)
Subjective:  Patient ID: Blake Ramirez, male    DOB: 1956-08-04  Age: 67 y.o. MRN: [id number]  CC: Hypertension   HPI Blake Ramirez presents for f/up ---  He complains of a 1 year history of low libido and erectile dysfunction.  He is active and denies chest pain, shortness of breath, diaphoresis, or edema.  Outpatient Medications Prior to Visit  Medication Sig Dispense Refill   amLODipine (NORVASC) 5 MG tablet TAKE 1 TABLET (5 MG TOTAL) BY MOUTH DAILY. 90 tablet 0   cetirizine (ZYRTEC) 10 MG tablet Take 1 tablet (10 mg total) by mouth daily. 90 tablet 1   Cholecalciferol 50 MCG (2000 UT) TABS Take 1 tablet (2,000 Units total) by mouth daily. 90 tablet 1   rosuvastatin (CRESTOR) 20 MG tablet TAKE 1 TABLET BY MOUTH EVERY DAY 90 tablet 1   No facility-administered medications prior to visit.    ROS Review of Systems  Constitutional:  Positive for unexpected weight change. Negative for chills, diaphoresis and fatigue.  HENT: Negative.    Eyes: Negative.   Respiratory:  Negative for cough, chest tightness, shortness of breath and wheezing.   Cardiovascular:  Negative for chest pain and leg swelling.  Gastrointestinal:  Negative for abdominal pain, constipation, diarrhea, nausea and vomiting.  Endocrine: Negative.   Genitourinary:  Negative for difficulty urinating.  Musculoskeletal: Negative.  Negative for arthralgias and myalgias.  Skin: Negative.  Negative for color change.  Neurological: Negative.  Negative for dizziness and weakness.  Hematological:  Negative for adenopathy. Does not bruise/bleed easily.  Psychiatric/Behavioral: Negative.      Objective:  BP 132/76 (BP Location: Left Arm, Patient Position: Sitting, Cuff Size: Large)   Pulse 95   Temp 98.2 F (36.8 C) (Oral)   Resp 16   Ht '5\' 11"'$  (1.803 m)   Wt 167 lb (75.8 kg)   SpO2 97%   BMI 23.29 kg/m   BP Readings from Last 3 Encounters:  11/03/22 132/76  05/04/22 (!) 142/78  02/25/22 (!) 152/78     Wt Readings from Last 3 Encounters:  11/03/22 167 lb (75.8 kg)  05/04/22 160 lb (72.6 kg)  02/25/22 164 lb (74.4 kg)    Physical Exam Vitals reviewed.  HENT:     Mouth/Throat:     Mouth: Mucous membranes are moist.  Eyes:     General: No scleral icterus.    Conjunctiva/sclera: Conjunctivae normal.  Cardiovascular:     Rate and Rhythm: Normal rate and regular rhythm.     Heart sounds: No murmur heard. Pulmonary:     Effort: Pulmonary effort is normal.     Breath sounds: No stridor. No wheezing, rhonchi or rales.  Abdominal:     General: Abdomen is flat.     Palpations: There is no mass.     Tenderness: There is no abdominal tenderness. There is no guarding.     Hernia: No hernia is present.  Musculoskeletal:        General: Normal range of motion.     Cervical back: Neck supple.     Right lower leg: No edema.     Left lower leg: No edema.  Lymphadenopathy:     Cervical: No cervical adenopathy.  Skin:    General: Skin is warm and dry.  Neurological:     General: No focal deficit present.     Mental Status: He is alert.  Psychiatric:        Mood and Affect: Mood normal.  Behavior: Behavior normal.     Lab Results  Component Value Date   WBC 6.2 11/03/2022   HGB 14.4 11/03/2022   HCT 42.1 11/03/2022   PLT 241.0 11/03/2022   GLUCOSE 85 11/03/2022   CHOL 230 (H) 02/25/2022   TRIG 222.0 (H) 02/25/2022   HDL 44.90 02/25/2022   LDLDIRECT 160.0 02/25/2022   ALT 21 02/25/2022   AST 20 02/25/2022   NA 138 11/03/2022   K 4.9 11/03/2022   CL 100 11/03/2022   CREATININE 1.15 11/03/2022   BUN 16 11/03/2022   CO2 31 11/03/2022   TSH 1.98 02/25/2022   PSA 1.01 02/25/2022    CT CHEST LUNG CA SCREEN LOW DOSE W/O CM  Result Date: 06/12/2022 CLINICAL DATA:  Current smoker, 20 pack-year history. EXAM: CT CHEST WITHOUT CONTRAST LOW-DOSE FOR LUNG CANCER SCREENING TECHNIQUE: Multidetector CT imaging of the chest was performed following the standard protocol  without IV contrast. RADIATION DOSE REDUCTION: This exam was performed according to the departmental dose-optimization program which includes automated exposure control, adjustment of the mA and/or kV according to patient size and/or use of iterative reconstruction technique. COMPARISON:  None Available. FINDINGS: Cardiovascular: Atherosclerotic calcification of the aorta and coronary arteries. Heart size normal. No pericardial effusion. Mediastinum/Nodes: No pathologically enlarged mediastinal or axillary lymph nodes. Hilar regions are difficult to definitively evaluate without IV contrast. Esophagus is grossly unremarkable. Lungs/Pleura: Centrilobular and paraseptal emphysema. Mild smoking related respiratory bronchiolitis. No suspicious pulmonary nodules. No pleural fluid. Airway is unremarkable. Upper Abdomen: Subcentimeter low-attenuation lesions in the liver are too small to characterize. Visualized portions of the liver, gallbladder, adrenal glands, kidneys, spleen, pancreas, stomach and bowel are grossly unremarkable. Musculoskeletal: Mild degenerative changes in the spine. No worrisome lytic or sclerotic lesions. IMPRESSION: 1. Lung-RADS 1, negative. Continue annual screening with low-dose chest CT without contrast in 12 months. 2. Aortic atherosclerosis (ICD10-I70.0). Coronary artery calcification. 3.  Emphysema (ICD10-J43.9). Electronically Signed   By: Lorin Picket M.D.   On: 06/12/2022 08:43   Assessment & Plan:   Blake Ramirez was seen today for hypertension.  Diagnoses and all orders for this visit:  Primary hypertension- His blood pressure is well-controlled. -     CBC with Differential/Platelet; Future -     Basic metabolic panel; Future -     Basic metabolic panel -     CBC with Differential/Platelet  Hyperlipidemia LDL goal <100- LDL goal achieved. Doing well on the statin   Need for prophylactic vaccination with combined diphtheria-tetanus-pertussis (DTP) vaccine -     Tdap  (BOOSTRIX) 5-2.5-18.5 LF-MCG/0.5 injection; Inject 0.5 mLs into the muscle once for 1 dose.  Need for varicella vaccine -     Zoster Vaccine Adjuvanted [facility]) injection; Inject 0.5 mLs into the muscle once for 1 dose.  Low libido- Labs are negative for secondary causes. -     Testosterone Total,Free,Bio, Males; Future -     Prolactin; Future -     Prolactin -     Testosterone Total,Free,Bio, Males  Erectile dysfunction associated with vasculopathy -     Testosterone Total,Free,Bio, Males; Future -     Prolactin; Future -     Prolactin -     Testosterone Total,Free,Bio, Males -     sildenafil (VIAGRA) 100 MG tablet; Take 1 tablet (100 mg total) by mouth daily as needed for erectile dysfunction.  Atherosclerosis of aorta (Protivin)- Risk factor modifications have been addressed.   I am having Blake Ramirez start on Boostrix, Shingrix, and sildenafil. I  am also having him maintain his cetirizine, Cholecalciferol, rosuvastatin, and amLODipine.  Meds ordered this encounter  Medications   Tdap (BOOSTRIX) 5-2.5-18.5 LF-MCG/0.5 injection    Sig: Inject 0.5 mLs into the muscle once for 1 dose.    Dispense:  0.5 mL    Refill:  0   Zoster Vaccine Adjuvanted [facility]) injection    Sig: Inject 0.5 mLs into the muscle once for 1 dose.    Dispense:  0.5 mL    Refill:  1   sildenafil (VIAGRA) 100 MG tablet    Sig: Take 1 tablet (100 mg total) by mouth daily as needed for erectile dysfunction.    Dispense:  8 tablet    Refill:  5     Follow-up: Return in about 4 months (around 03/05/2023).  Scarlette Calico, MD

## 2022-11-03 NOTE — Patient Instructions (Signed)
Hypertension, Adult High blood pressure (hypertension) is when the force of blood pumping through the arteries is too strong. The arteries are the blood vessels that carry blood from the heart throughout the body. Hypertension forces the heart to work harder to pump blood and may cause arteries to become narrow or stiff. Untreated or uncontrolled hypertension can lead to a heart attack, heart failure, a stroke, kidney disease, and other problems. A blood pressure reading consists of a higher number over a lower number. Ideally, your blood pressure should be below 120/80. The first ("top") number is called the systolic pressure. It is a measure of the pressure in your arteries as your heart beats. The second ("bottom") number is called the diastolic pressure. It is a measure of the pressure in your arteries as the heart relaxes. What are the causes? The exact cause of this condition is not known. There are some conditions that result in high blood pressure. What increases the risk? Certain factors may make you more likely to develop high blood pressure. Some of these risk factors are under your control, including: Smoking. Not getting enough exercise or physical activity. Being overweight. Having too much fat, sugar, calories, or salt (sodium) in your diet. Drinking too much alcohol. Other risk factors include: Having a personal history of heart disease, diabetes, high cholesterol, or kidney disease. Stress. Having a family history of high blood pressure and high cholesterol. Having obstructive sleep apnea. Age. The risk increases with age. What are the signs or symptoms? High blood pressure may not cause symptoms. Very high blood pressure (hypertensive crisis) may cause: Headache. Fast or irregular heartbeats (palpitations). Shortness of breath. Nosebleed. Nausea and vomiting. Vision changes. Severe chest pain, dizziness, and seizures. How is this diagnosed? This condition is diagnosed by  measuring your blood pressure while you are seated, with your arm resting on a flat surface, your legs uncrossed, and your feet flat on the floor. The cuff of the blood pressure monitor will be placed directly against the skin of your upper arm at the level of your heart. Blood pressure should be measured at least twice using the same arm. Certain conditions can cause a difference in blood pressure between your right and left arms. If you have a high blood pressure reading during one visit or you have normal blood pressure with other risk factors, you may be asked to: Return on a different day to have your blood pressure checked again. Monitor your blood pressure at home for 1 week or longer. If you are diagnosed with hypertension, you may have other blood or imaging tests to help your health care provider understand your overall risk for other conditions. How is this treated? This condition is treated by making healthy lifestyle changes, such as eating healthy foods, exercising more, and reducing your alcohol intake. You may be referred for counseling on a healthy diet and physical activity. Your health care provider may prescribe medicine if lifestyle changes are not enough to get your blood pressure under control and if: Your systolic blood pressure is above 130. Your diastolic blood pressure is above 80. Your personal target blood pressure may vary depending on your medical conditions, your age, and other factors. Follow these instructions at home: Eating and drinking  Eat a diet that is high in fiber and potassium, and low in sodium, added sugar, and fat. An example of this eating plan is called the DASH diet. DASH stands for Dietary Approaches to Stop Hypertension. To eat this way: Eat   plenty of fresh fruits and vegetables. Try to fill one half of your plate at each meal with fruits and vegetables. Eat whole grains, such as whole-wheat pasta, brown rice, or whole-grain bread. Fill about one  fourth of your plate with whole grains. Eat or drink low-fat dairy products, such as skim milk or low-fat yogurt. Avoid fatty cuts of meat, processed or cured meats, and poultry with skin. Fill about one fourth of your plate with lean proteins, such as fish, chicken without skin, beans, eggs, or tofu. Avoid pre-made and processed foods. These tend to be higher in sodium, added sugar, and fat. Reduce your daily sodium intake. Many people with hypertension should eat less than 1,500 mg of sodium a day. Do not drink alcohol if: Your health care provider tells you not to drink. You are pregnant, may be pregnant, or are planning to become pregnant. If you drink alcohol: Limit how much you have to: 0-1 drink a day for women. 0-2 drinks a day for men. Know how much alcohol is in your drink. In the [place] one drink equals one 12 oz bottle of beer (355 mL), one 5 oz glass of wine (148 mL), or one 1 oz glass of hard liquor (44 mL). Lifestyle  Work with your health care provider to maintain a healthy body weight or to lose weight. Ask what an ideal weight is for you. Get at least 30 minutes of exercise that causes your heart to beat faster (aerobic exercise) most days of the week. Activities may include walking, swimming, or biking. Include exercise to strengthen your muscles (resistance exercise), such as Pilates or lifting weights, as part of your weekly exercise routine. Try to do these types of exercises for 30 minutes at least 3 days a week. Do not use any products that contain nicotine or tobacco. These products include cigarettes, chewing tobacco, and vaping devices, such as e-cigarettes. If you need help quitting, ask your health care provider. Monitor your blood pressure at home as told by your health care provider. Keep all follow-up visits. This is important. Medicines Take over-the-counter and prescription medicines only as told by your health care provider. Follow directions carefully. Blood  pressure medicines must be taken as prescribed. Do not skip doses of blood pressure medicine. Doing this puts you at risk for problems and can make the medicine less effective. Ask your health care provider about side effects or reactions to medicines that you should watch for. Contact a health care provider if you: Think you are having a reaction to a medicine you are taking. Have headaches that keep coming back (recurring). Feel dizzy. Have swelling in your ankles. Have trouble with your vision. Get help right away if you: Develop a severe headache or confusion. Have unusual weakness or numbness. Feel faint. Have severe pain in your chest or abdomen. Vomit repeatedly. Have trouble breathing. These symptoms may be an emergency. Get help right away. Call 911. Do not wait to see if the symptoms will go away. Do not drive yourself to the hospital. Summary Hypertension is when the force of blood pumping through your arteries is too strong. If this condition is not controlled, it may put you at risk for serious complications. Your personal target blood pressure may vary depending on your medical conditions, your age, and other factors. For most people, a normal blood pressure is less than 120/80. Hypertension is treated with lifestyle changes, medicines, or a combination of both. Lifestyle changes include losing weight, eating a healthy,   low-sodium diet, exercising more, and limiting alcohol. This information is not intended to replace advice given to you by your health care provider. Make sure you discuss any questions you have with your health care provider. Document Revised: 06/17/2021 Document Reviewed: 06/17/2021 Elsevier Patient Education  2023 [employer]  

## 2022-11-04 LAB — TESTOSTERONE TOTAL,FREE,BIO, MALES
Albumin: 4.5 g/dL (ref 3.6–5.1)
Sex Hormone Binding: 48 nmol/L (ref 22–77)
Testosterone, Bioavailable: 101.9 ng/dL — ABNORMAL LOW (ref 110.0–575.0)
Testosterone, Free: 49.6 pg/mL (ref 46.0–224.0)
Testosterone: 508 ng/dL (ref 250–827)

## 2022-11-04 LAB — PROLACTIN: Prolactin: 3.2 ng/mL (ref 2.0–18.0)

## 2022-11-04 MED ORDER — SILDENAFIL CITRATE 100 MG PO TABS: 100.0000 mg | ORAL_TABLET | Freq: Every day | ORAL | 5 refills | Status: DC | PRN

## 2023-02-11 ENCOUNTER — Other Ambulatory Visit: Payer: Self-pay | Admitting: Internal Medicine

## 2023-02-11 DIAGNOSIS — E785 Hyperlipidemia, unspecified: Secondary | ICD-10-CM

## 2023-03-04 ENCOUNTER — Ambulatory Visit (INDEPENDENT_AMBULATORY_CARE_PROVIDER_SITE_OTHER): Payer: Medicare Other | Admitting: Internal Medicine

## 2023-03-04 ENCOUNTER — Encounter: Payer: Self-pay | Admitting: Internal Medicine

## 2023-03-04 VITALS — BP 132/72 | HR 90 | Temp 98.1°F | Resp 16 | Ht 71.0 in | Wt 166.0 lb

## 2023-03-04 DIAGNOSIS — J410 Simple chronic bronchitis: Secondary | ICD-10-CM | POA: Diagnosis not present

## 2023-03-04 DIAGNOSIS — Z0001 Encounter for general adult medical examination with abnormal findings: Secondary | ICD-10-CM

## 2023-03-04 DIAGNOSIS — N400 Enlarged prostate without lower urinary tract symptoms: Secondary | ICD-10-CM

## 2023-03-04 DIAGNOSIS — Z0000 Encounter for general adult medical examination without abnormal findings: Secondary | ICD-10-CM

## 2023-03-04 DIAGNOSIS — Z23 Encounter for immunization: Secondary | ICD-10-CM

## 2023-03-04 DIAGNOSIS — I10 Essential (primary) hypertension: Secondary | ICD-10-CM | POA: Diagnosis not present

## 2023-03-04 DIAGNOSIS — I700 Atherosclerosis of aorta: Secondary | ICD-10-CM | POA: Diagnosis not present

## 2023-03-04 DIAGNOSIS — E785 Hyperlipidemia, unspecified: Secondary | ICD-10-CM

## 2023-03-04 LAB — LIPID PANEL
Cholesterol: 166 mg/dL (ref 0–200)
HDL: 38.9 mg/dL — ABNORMAL LOW (ref >39.00)
LDL Cholesterol: 94 mg/dL (ref 0–99)
NonHDL: 127.3
Total CHOL/HDL Ratio: 4
Triglycerides: 169 mg/dL — ABNORMAL HIGH (ref 0.0–149.0)
VLDL: 33.8 mg/dL (ref 0.0–40.0)

## 2023-03-04 LAB — TSH: TSH: 1.92 u[IU]/mL (ref 0.35–5.50)

## 2023-03-04 LAB — PSA: PSA: 0.89 ng/mL (ref 0.10–4.00)

## 2023-03-04 MED ORDER — BOOSTRIX 5-2.5-18.5 LF-MCG/0.5 IM SUSP
0.5000 mL | Freq: Once | INTRAMUSCULAR | 0 refills | Status: AC
Start: 2023-03-04 — End: 2023-03-04

## 2023-03-04 MED ORDER — SHINGRIX 50 MCG/0.5ML IM SUSR: 0.5000 mL | Freq: Once | INTRAMUSCULAR | 1 refills | Status: AC

## 2023-03-04 NOTE — Patient Instructions (Signed)
Health Maintenance, Male Adopting a healthy lifestyle and getting preventive care are important in promoting health and wellness. Ask your health care provider about: The right schedule for you to have regular tests and exams. Things you can do on your own to prevent diseases and keep yourself healthy. What should I know about diet, weight, and exercise? Eat a healthy diet  Eat a diet that includes plenty of vegetables, fruits, low-fat dairy products, and lean protein. Do not eat a lot of foods that are high in solid fats, added sugars, or sodium. Maintain a healthy weight Body mass index (BMI) is a measurement that can be used to identify possible weight problems. It estimates body fat based on height and weight. Your health care provider can help determine your BMI and help you achieve or maintain a healthy weight. Get regular exercise Get regular exercise. This is one of the most important things you can do for your health. Most adults should: Exercise for at least 150 minutes each week. The exercise should increase your heart rate and make you sweat (moderate-intensity exercise). Do strengthening exercises at least twice a week. This is in addition to the moderate-intensity exercise. Spend less time sitting. Even light physical activity can be beneficial. Watch cholesterol and blood lipids Have your blood tested for lipids and cholesterol at 66 years of age, then have this test every 5 years. You may need to have your cholesterol levels checked more often if: Your lipid or cholesterol levels are high. You are older than 66 years of age. You are at high risk for heart disease. What should I know about cancer screening? Many types of cancers can be detected early and may often be prevented. Depending on your health history and family history, you may need to have cancer screening at various ages. This may include screening for: Colorectal cancer. Prostate cancer. Skin cancer. Lung  cancer. What should I know about heart disease, diabetes, and high blood pressure? Blood pressure and heart disease High blood pressure causes heart disease and increases the risk of stroke. This is more likely to develop in people who have high blood pressure readings or are overweight. Talk with your health care provider about your target blood pressure readings. Have your blood pressure checked: Every 3-5 years if you are 18-39 years of age. Every year if you are 40 years old or older. If you are between the ages of 65 and 75 and are a current or former smoker, ask your health care provider if you should have a one-time screening for abdominal aortic aneurysm (AAA). Diabetes Have regular diabetes screenings. This checks your fasting blood sugar level. Have the screening done: Once every three years after age 45 if you are at a normal weight and have a low risk for diabetes. More often and at a younger age if you are overweight or have a high risk for diabetes. What should I know about preventing infection? Hepatitis B If you have a higher risk for hepatitis B, you should be screened for this virus. Talk with your health care provider to find out if you are at risk for hepatitis B infection. Hepatitis C Blood testing is recommended for: Everyone born from 1945 through 1965. Anyone with known risk factors for hepatitis C. Sexually transmitted infections (STIs) You should be screened each year for STIs, including gonorrhea and chlamydia, if: You are sexually active and are younger than 66 years of age. You are older than 66 years of age and your   health care provider tells you that you are at risk for this type of infection. Your sexual activity has changed since you were last screened, and you are at increased risk for chlamydia or gonorrhea. Ask your health care provider if you are at risk. Ask your health care provider about whether you are at high risk for HIV. Your health care provider  may recommend a prescription medicine to help prevent HIV infection. If you choose to take medicine to prevent HIV, you should first get tested for HIV. You should then be tested every 3 months for as long as you are taking the medicine. Follow these instructions at home: Alcohol use Do not drink alcohol if your health care provider tells you not to drink. If you drink alcohol: Limit how much you have to 0-2 drinks a day. Know how much alcohol is in your drink. In the [place] one drink equals one 12 oz bottle of beer (355 mL), one 5 oz glass of wine (148 mL), or one 1 oz glass of hard liquor (44 mL). Lifestyle Do not use any products that contain nicotine or tobacco. These products include cigarettes, chewing tobacco, and vaping devices, such as e-cigarettes. If you need help quitting, ask your health care provider. Do not use street drugs. Do not share needles. Ask your health care provider for help if you need support or information about quitting drugs. General instructions Schedule regular health, dental, and eye exams. Stay current with your vaccines. Tell your health care provider if: You often feel depressed. You have ever been abused or do not feel safe at home. Summary Adopting a healthy lifestyle and getting preventive care are important in promoting health and wellness. Follow your health care provider's instructions about healthy diet, exercising, and getting tested or screened for diseases. Follow your health care provider's instructions on monitoring your cholesterol and blood pressure. This information is not intended to replace advice given to you by your health care provider. Make sure you discuss any questions you have with your health care provider. Document Revised: 12/30/2020 Document Reviewed: 12/30/2020 [employer]vier Patient Education  2024 [employer]  

## 2023-03-04 NOTE — Progress Notes (Unsigned)
Subjective:  Patient ID: Blake Ramirez, male    DOB: February 19, 1956  Age: 67 y.o. MRN: [id number]  CC: Annual Exam, Hypertension, and Hyperlipidemia   HPI Jayceion Lisenby presents for a CPX and f/up ---  Discussed the use of AI scribe software for clinical note transcription with the patient, who gave verbal consent to proceed.  History of Present Illness   The patient, with a history of allergies, presents with a complaint of coughing, which he attributes to exposure to grass and leaf dust while performing yard work. He admits to not wearing a mask during these activities, despite advice to do so. The cough is non-productive, and he denies any associated chest pain, shortness of breath, dizziness, or lightheadedness. He has been managing his allergies with an antihistamine, which he takes as needed.  The patient also reports an increase in weight, but denies any changes in appetite, painful urination, or bloody urination. He has a history of smoking and moderate alcohol consumption, primarily beer, which is usually consumed during football games. He has completed a Cologuard test in the previous year, which was negative.  He expresses a desire to receive several vaccinations, including tetanus, RSV, and shingles, but prefers not to receive them all at once due to a previous experience of prolonged coughing following vaccination. He plans to schedule these vaccinations at his local [facility] pharmacy.       Outpatient Medications Prior to Visit  Medication Sig Dispense Refill   amLODipine (NORVASC) 5 MG tablet TAKE 1 TABLET (5 MG TOTAL) BY MOUTH DAILY. 90 tablet 0   cetirizine (ZYRTEC) 10 MG tablet Take 1 tablet (10 mg total) by mouth daily. 90 tablet 1   Cholecalciferol 50 MCG (2000 UT) TABS Take 1 tablet (2,000 Units total) by mouth daily. 90 tablet 1   rosuvastatin (CRESTOR) 20 MG tablet TAKE 1 TABLET BY MOUTH EVERY DAY 90 tablet 0   sildenafil (VIAGRA) 100 MG tablet Take 1 tablet (100 mg  total) by mouth daily as needed for erectile dysfunction. 8 tablet 5   No facility-administered medications prior to visit.    ROS Review of Systems  Objective:  BP 132/72 (BP Location: Left Arm, Patient Position: Sitting, Cuff Size: Large)   Pulse 90   Temp 98.1 F (36.7 C) (Oral)   Resp 16   Ht 5\' 11"  (1.803 m)   Wt 166 lb (75.3 kg)   SpO2 92%   BMI 23.15 kg/m   BP Readings from Last 3 Encounters:  03/04/23 132/72  11/03/22 132/76  05/04/22 (!) 142/78    Wt Readings from Last 3 Encounters:  03/04/23 166 lb (75.3 kg)  11/03/22 167 lb (75.8 kg)  05/04/22 160 lb (72.6 kg)    Physical Exam Cardiovascular:     Rate and Rhythm: Normal rate and regular rhythm.     Heart sounds: Normal heart sounds, S1 normal and S2 normal. No murmur heard.    No friction rub. No gallop.     Comments: EKG- NSR, 85 bpm ?LAE No LVH, Q waves, or ST/T wave changes Abdominal:     Hernia: There is no hernia in the left inguinal area or right inguinal area.  Genitourinary:    Pubic Area: No rash.      Penis: Normal.      Testes: Normal.     Epididymis:     Right: Normal.     Left: Normal.     Prostate: Enlarged. Not tender and no nodules present.  Rectum: Normal. Guaiac result negative. No mass, tenderness, anal fissure, external hemorrhoid or internal hemorrhoid. Normal anal tone.  Musculoskeletal:     Right lower leg: No edema.     Left lower leg: No edema.  Lymphadenopathy:     Lower Body: No right inguinal adenopathy. No left inguinal adenopathy.     Lab Results  Component Value Date   WBC 6.2 11/03/2022   HGB 14.4 11/03/2022   HCT 42.1 11/03/2022   PLT 241.0 11/03/2022   GLUCOSE 85 11/03/2022   CHOL 166 03/04/2023   TRIG 169.0 (H) 03/04/2023   HDL 38.90 (L) 03/04/2023   LDLDIRECT 160.0 02/25/2022   LDLCALC 94 03/04/2023   ALT 21 02/25/2022   AST 20 02/25/2022   NA 138 11/03/2022   K 4.9 11/03/2022   CL 100 11/03/2022   CREATININE 1.15 11/03/2022   BUN 16  11/03/2022   CO2 31 11/03/2022   TSH 1.92 03/04/2023   PSA 0.89 03/04/2023    CT CHEST LUNG CA SCREEN LOW DOSE W/O CM  Result Date: 06/12/2022 CLINICAL DATA:  Current smoker, 20 pack-year history. EXAM: CT CHEST WITHOUT CONTRAST LOW-DOSE FOR LUNG CANCER SCREENING TECHNIQUE: Multidetector CT imaging of the chest was performed following the standard protocol without IV contrast. RADIATION DOSE REDUCTION: This exam was performed according to the departmental dose-optimization program which includes automated exposure control, adjustment of the mA and/or kV according to patient size and/or use of iterative reconstruction technique. COMPARISON:  None Available. FINDINGS: Cardiovascular: Atherosclerotic calcification of the aorta and coronary arteries. Heart size normal. No pericardial effusion. Mediastinum/Nodes: No pathologically enlarged mediastinal or axillary lymph nodes. Hilar regions are difficult to definitively evaluate without IV contrast. Esophagus is grossly unremarkable. Lungs/Pleura: Centrilobular and paraseptal emphysema. Mild smoking related respiratory bronchiolitis. No suspicious pulmonary nodules. No pleural fluid. Airway is unremarkable. Upper Abdomen: Subcentimeter low-attenuation lesions in the liver are too small to characterize. Visualized portions of the liver, gallbladder, adrenal glands, kidneys, spleen, pancreas, stomach and bowel are grossly unremarkable. Musculoskeletal: Mild degenerative changes in the spine. No worrisome lytic or sclerotic lesions. IMPRESSION: 1. Lung-RADS 1, negative. Continue annual screening with low-dose chest CT without contrast in 12 months. 2. Aortic atherosclerosis (ICD10-I70.0). Coronary artery calcification. 3.  Emphysema (ICD10-J43.9). Electronically Signed   By: Leanna Battles M.D.   On: 06/12/2022 08:43   Assessment & Plan:  Hyperlipidemia LDL goal <100 -     Lipoprotein A (LPA); Future -     Lipid panel; Future -     TSH; Future -     CT  CARDIAC SCORING (SELF PAY ONLY); Future  Benign prostatic hyperplasia without lower urinary tract symptoms -     PSA; Future  Atherosclerosis of aorta (HCC) -     Lipoprotein A (LPA); Future -     Lipid panel; Future -     CT CARDIAC SCORING (SELF PAY ONLY); Future  Primary hypertension -     TSH; Future -     EKG 12-Lead  Encounter for general adult medical examination with abnormal findings  Need for prophylactic vaccination with combined diphtheria-tetanus-pertussis (DTP) vaccine -     Boostrix; Inject 0.5 mLs into the muscle once for 1 dose.  Dispense: 0.5 mL; Refill: 0  Simple chronic bronchitis (HCC)  Other orders -     Shingrix; Inject 0.5 mLs into the muscle once for 1 dose.  Dispense: 0.5 mL; Refill: 1     Follow-up: Return in about 6 months (around 09/04/2023).  Sanda Linger,  MD

## 2023-03-05 DIAGNOSIS — J410 Simple chronic bronchitis: Secondary | ICD-10-CM | POA: Insufficient documentation

## 2023-03-08 LAB — LIPOPROTEIN A (LPA): Lipoprotein (a): 182 nmol/L — ABNORMAL HIGH (ref <75)

## 2023-03-10 ENCOUNTER — Other Ambulatory Visit: Payer: Self-pay | Admitting: Internal Medicine

## 2023-03-12 ENCOUNTER — Encounter: Payer: Self-pay | Admitting: Internal Medicine

## 2023-04-09 ENCOUNTER — Other Ambulatory Visit: Payer: Medicare Other

## 2023-05-09 ENCOUNTER — Other Ambulatory Visit: Payer: Self-pay | Admitting: Internal Medicine

## 2023-05-09 DIAGNOSIS — E785 Hyperlipidemia, unspecified: Secondary | ICD-10-CM

## 2023-05-12 ENCOUNTER — Ambulatory Visit
Admission: RE | Admit: 2023-05-12 | Discharge: 2023-05-12 | Disposition: A | Discharge status: Home / Self Care | Payer: No Typology Code available for payment source | Source: Ambulatory Visit | Attending: Internal Medicine | Admitting: Internal Medicine

## 2023-05-12 DIAGNOSIS — E785 Hyperlipidemia, unspecified: Secondary | ICD-10-CM

## 2023-05-12 DIAGNOSIS — I700 Atherosclerosis of aorta: Secondary | ICD-10-CM

## 2023-05-16 ENCOUNTER — Other Ambulatory Visit: Payer: Self-pay | Admitting: Internal Medicine

## 2023-05-16 DIAGNOSIS — R931 Abnormal findings on diagnostic imaging of heart and coronary circulation: Secondary | ICD-10-CM | POA: Insufficient documentation

## 2023-05-17 ENCOUNTER — Telehealth: Payer: Self-pay | Admitting: Internal Medicine

## 2023-05-17 NOTE — Telephone Encounter (Signed)
Pt is calling about referral [id number]  Pt states that he has a appointment with Dr Jacinto Halim @ [address].

## 2023-05-25 ENCOUNTER — Ambulatory Visit: Payer: Medicare Other | Attending: Cardiology | Admitting: Cardiology

## 2023-05-25 ENCOUNTER — Encounter: Payer: Self-pay | Admitting: *Deleted

## 2023-05-25 ENCOUNTER — Encounter: Payer: Self-pay | Admitting: Cardiology

## 2023-05-25 VITALS — BP 138/70 | HR 61 | Resp 16 | Ht 71.0 in | Wt 162.4 lb

## 2023-05-25 DIAGNOSIS — F17200 Nicotine dependence, unspecified, uncomplicated: Secondary | ICD-10-CM

## 2023-05-25 DIAGNOSIS — R931 Abnormal findings on diagnostic imaging of heart and coronary circulation: Secondary | ICD-10-CM | POA: Diagnosis not present

## 2023-05-25 DIAGNOSIS — I10 Essential (primary) hypertension: Secondary | ICD-10-CM | POA: Diagnosis not present

## 2023-05-25 DIAGNOSIS — I209 Angina pectoris, unspecified: Secondary | ICD-10-CM | POA: Diagnosis not present

## 2023-05-25 DIAGNOSIS — E7800 Pure hypercholesterolemia, unspecified: Secondary | ICD-10-CM

## 2023-05-25 MED ORDER — EZETIMIBE 10 MG PO TABS
10.0000 mg | ORAL_TABLET | Freq: Every day | ORAL | 3 refills | Status: DC
Start: 2023-05-25 — End: 2023-11-08

## 2023-05-25 NOTE — Progress Notes (Unsigned)
Cardiology Office Note:  .   Date:  05/26/2023  ID:  Blake Ramirez, DOB September 20, 1955, MRN [id number] PCP: Blake Grandchild, MD  [facility] Providers Cardiologist:  Blake Decamp, MD    History of Present Illness: .   Blake Ramirez is a 67 y.o. male   Discussed the use of AI scribe software for clinical note transcription with the patient, who gave verbal consent to proceed.  History of Present Illness   The patient, a 67 year old with a history of high cholesterol and blood pressure, presents with chest discomfort that occurs during strenuous activities such as cutting grass. The discomfort began at the start of the summer and is relieved by rest. The patient denies experiencing shortness of breath during these episodes. The patient has a high coronary calcium score, indicating the presence of blocked arteries in the heart.  In addition to these symptoms, the patient has made significant lifestyle changes. They have decided to quit smoking, a habit they have had for many years. The patient has also modified their diet, incorporating healthier options such as oatmeal, fruits, vegetables, fish, and chicken. They have cut out bacon and eggs from their diet and are using soy milk instead of regular milk. The patient is also taking a vitamin D3 supplement.  The patient is currently on medications for high cholesterol and blood pressure, which they report taking regularly. They also take a daily baby aspirin. The patient reports that their blood pressure has been well-controlled with the current regimen.       Review of Systems  Cardiovascular:  Positive for chest pain. Negative for dyspnea on exertion and leg swelling.    Physical Exam Neck:     Vascular: No carotid bruit or JVD.  Cardiovascular:     Rate and Rhythm: Normal rate and regular rhythm.     Pulses: Intact distal pulses.     Heart sounds: Normal heart sounds. No murmur heard.    No gallop.  Pulmonary:     Effort:  Pulmonary effort is normal.     Breath sounds: Normal breath sounds.  Abdominal:     General: Bowel sounds are normal.     Palpations: Abdomen is soft.  Musculoskeletal:     Right lower leg: No edema.     Left lower leg: No edema.      Risk Assessment/Calculations:              Lab Results  Component Value Date   CHOL 166 03/04/2023   HDL 38.90 (L) 03/04/2023   LDLCALC 94 03/04/2023   LDLDIRECT 160.0 02/25/2022   TRIG 169.0 (H) 03/04/2023   CHOLHDL 4 03/04/2023   Lab Results  Component Value Date   NA 138 11/03/2022   K 4.9 11/03/2022   CO2 31 11/03/2022   GLUCOSE 85 11/03/2022   BUN 16 11/03/2022   CREATININE 1.15 11/03/2022   CALCIUM 10.2 11/03/2022   GFR 66.61 11/03/2022   Lab Results  Component Value Date   WBC 6.2 11/03/2022   HGB 14.4 11/03/2022   HCT 42.1 11/03/2022   MCV 93.1 11/03/2022   PLT 241.0 11/03/2022   Physical Exam:   VS:  BP 138/70   Pulse 61   Resp 16   Ht 5\' 11"  (1.803 m)   Wt 162 lb 6.4 oz (73.7 kg)   SpO2 98%   BMI 22.65 kg/m    Wt Readings from Last 3 Encounters:  05/25/23 162 lb 6.4 oz (73.7 kg)  03/04/23 166  lb (75.3 kg)  11/03/22 167 lb (75.8 kg)     Neck:     Vascular: No carotid bruit or JVD.  Pulmonary:     Effort: Pulmonary effort is normal.     Breath sounds: Normal breath sounds.  Cardiovascular:     Normal rate. Regular rhythm. Normal heart sounds.     No gallop.   Pulses:    Intact distal pulses.  Edema:    Pretibial: no edema of the left pretibial area and no edema of the right pretibial area. Abdominal:     General: Bowel sounds are normal.     Palpations: Abdomen is soft.      Studies Reviewed: Marland Kitchen        Coronary calcium score 05/12/2023: LM 149 LAD 709 LCx 0 RCA 295 Total Agatston score 1153. [place] database percentile 98. Ascending and descending thoracic aortic measurements are normal.  ASSESSMENT AND PLAN: .      ICD-10-CM   1. Elevated coronary artery calcium score: Coronary calcium  score 05/12/2023: Total Agatston score 1153. [place] database percentile 98.  R93.1 EKG 12-Lead    2. Angina pectoris (HCC)  I20.9 ECHOCARDIOGRAM COMPLETE    MYOCARDIAL PERFUSION IMAGING    3. Hypercholesteremia  E78.00 ezetimibe (ZETIA) 10 MG tablet    Lipid Profile    4. Primary hypertension  I10     5. Tobacco use disorder  F17.200       Assessment and Plan    Coronary Artery Disease Elevated coronary calcium score (1153) with exertional chest discomfort suggestive of angina pectoris. Patient is a current smoker and has made dietary changes. -Continue daily aspirin. -Order nuclear stress test and echocardiogram to further evaluate. -Add Zetia 10mg  daily to current rosuvastatin regimen to further lower LDL cholesterol. -Follow-up in 4-6 weeks.  Hypertension Well controlled on current medication regimen. -Continue current antihypertensive medication.  Smoking Cessation Patient is motivated to quit smoking. -Encourage continued efforts to quit smoking.  Dietary Changes Patient has made significant dietary changes including increased oatmeal consumption, decreased red meat intake, and increased fruit and vegetable intake. -Encourage continued dietary changes. -Advise patient that eggs can be included in the diet in moderation.  Vitamin D Deficiency Patient is taking over-the-counter vitamin D3/K2 supplement, but dose is unclear. -Advise patient to ensure vitamin D3 dose is 2000-3000 units daily. -If current supplement does not meet this dose, recommend over-the-counter vitamin D3 supplement.  Follow-up Lipid profile testing in 1 month.          Informed Consent   Shared Decision Making/Informed Consent The risks [chest pain, shortness of breath, cardiac arrhythmias, dizziness, blood pressure fluctuations, myocardial infarction, stroke/transient ischemic attack, nausea, vomiting, allergic reaction, radiation exposure, metallic taste sensation and life-threatening  complications (estimated to be 1 in 10,000)], benefits (risk stratification, diagnosing coronary artery disease, treatment guidance) and alternatives of a nuclear stress test were discussed in detail with Blake Ramirez and he agrees to proceed.     Signed,  Blake Decamp, MD, [facility] 05/26/2023, 7:18 AM

## 2023-05-25 NOTE — Patient Instructions (Signed)
Medication Instructions:  Your physician has recommended you make the following change in your medication: Start Ezetimibe 10 mg by mouth daily   *If you need a refill on your cardiac medications before your next appointment, please call your pharmacy*   Lab Work: Your physician recommends that you return for lab work in: one month--Lipid profile.  This will be fasting.   If you have labs (blood work) drawn today and your tests are completely normal, you will receive your results only by: MyChart Message (if you have MyChart) OR A paper copy in the mail If you have any lab test that is abnormal or we need to change your treatment, we will call you to review the results.   Testing/Procedures: Your physician has requested that you have an echocardiogram. Echocardiography is a painless test that uses sound waves to create images of your heart. It provides your doctor with information about the size and shape of your heart and how well your heart's chambers and valves are working. This procedure takes approximately one hour. There are no restrictions for this procedure. Please do NOT wear cologne, perfume, aftershave, or lotions (deodorant is allowed). Please arrive 15 minutes prior to your appointment time.  Your physician has requested that you have an exercise stress myoview. For further information please visit [email or web address] Please follow instruction sheet, as given.    Follow-Up: At [facility], you and your health needs are our priority.  As part of our continuing mission to provide you with exceptional heart care, we have created designated Provider Care Teams.  These Care Teams include your primary Cardiologist (physician) and Advanced Practice Providers (APPs -  Physician Assistants and Nurse Practitioners) who all work together to provide you with the care you need, when you need it.  We recommend signing up for the patient portal called "MyChart".  Sign up  information is provided on this After Visit Summary.  MyChart is used to connect with patients for Virtual Visits (Telemedicine).  Patients are able to view lab/test results, encounter notes, upcoming appointments, etc.  Non-urgent messages can be sent to your provider as well.   To learn more about what you can do with MyChart, go to [email or web address].    Your next appointment:   4-6 week(s)  Provider:   Jari Favre, PA-C, Ronie Spies, PA-C, Robin Searing, NP, Jacolyn Reedy, PA-C, Eligha Bridegroom, NP, Tereso Newcomer, PA-C, or Perlie Gold, PA-C         Other Instructions

## 2023-05-26 ENCOUNTER — Other Ambulatory Visit: Payer: Self-pay | Admitting: *Deleted

## 2023-05-26 DIAGNOSIS — I209 Angina pectoris, unspecified: Secondary | ICD-10-CM

## 2023-05-26 NOTE — Progress Notes (Signed)
Attestation order 

## 2023-06-02 ENCOUNTER — Telehealth (HOSPITAL_COMMUNITY): Payer: Self-pay | Admitting: *Deleted

## 2023-06-02 NOTE — Telephone Encounter (Signed)
Patient given detailed instructions per Myocardial Perfusion Study Information Sheet for the test on 06/07/23 at 10:30. Patient notified to arrive 15 minutes early and that it is imperative to arrive on time for appointment to keep from having the test rescheduled.  If you need to cancel or reschedule your appointment, please call the office within 24 hours of your appointment. . Patient verbalized understanding.Blake Ramirez

## 2023-06-07 ENCOUNTER — Ambulatory Visit (HOSPITAL_COMMUNITY): Payer: Medicare Other | Attending: Cardiology

## 2023-06-07 DIAGNOSIS — I209 Angina pectoris, unspecified: Secondary | ICD-10-CM | POA: Diagnosis not present

## 2023-06-07 LAB — MYOCARDIAL PERFUSION IMAGING
Angina Index: 0
Duke Treadmill Score: 6
Estimated workload: 7
Exercise duration (min): 6 min
Exercise duration (sec): 18 s
LV dias vol: 88 mL (ref 62–150)
LV sys vol: 39 mL
MPHR: 154 {beats}/min
Nuc Stress EF: 56 %
Peak HR: 148 {beats}/min
Percent HR: 96 %
Rest HR: 57 {beats}/min
Rest Nuclear Isotope Dose: 10.1 mCi
SDS: 1
SRS: 0
SSS: 1
ST Depression (mm): 0 mm
Stress Nuclear Isotope Dose: 31.6 mCi
TID: 0.97

## 2023-06-07 MED ORDER — TECHNETIUM TC 99M TETROFOSMIN IV KIT
31.6000 | PACK | Freq: Once | INTRAVENOUS | Status: AC | PRN
  Administered 2023-06-07: 31.6 via INTRAVENOUS

## 2023-06-07 MED ORDER — TECHNETIUM TC 99M TETROFOSMIN IV KIT
10.1000 | PACK | Freq: Once | INTRAVENOUS | Status: AC | PRN
  Administered 2023-06-07: 10.1 via INTRAVENOUS

## 2023-06-10 ENCOUNTER — Telehealth: Payer: Self-pay | Admitting: Internal Medicine

## 2023-06-10 NOTE — Telephone Encounter (Signed)
I have spoke to pt in regard. Pt's concern is that multiple imaging being done was going to give him cancer. I stated that he would not get cancer from doing the scheduled exams he has upcoming.

## 2023-06-10 NOTE — Telephone Encounter (Signed)
Patient is concerned about all of the imaging that he has been having done and will be done.  Please call patient at:  [phone]

## 2023-06-14 ENCOUNTER — Ambulatory Visit (HOSPITAL_COMMUNITY)
Admission: RE | Admit: 2023-06-14 | Discharge: 2023-06-14 | Disposition: A | Discharge status: Home / Self Care | Payer: Medicare Other | Source: Ambulatory Visit | Attending: Internal Medicine | Admitting: Internal Medicine

## 2023-06-14 DIAGNOSIS — Z122 Encounter for screening for malignant neoplasm of respiratory organs: Secondary | ICD-10-CM | POA: Insufficient documentation

## 2023-06-14 DIAGNOSIS — Z87891 Personal history of nicotine dependence: Secondary | ICD-10-CM | POA: Insufficient documentation

## 2023-06-14 DIAGNOSIS — F17210 Nicotine dependence, cigarettes, uncomplicated: Secondary | ICD-10-CM | POA: Diagnosis not present

## 2023-06-17 ENCOUNTER — Ambulatory Visit (HOSPITAL_COMMUNITY): Payer: Medicare Other | Attending: Cardiology

## 2023-06-17 DIAGNOSIS — I209 Angina pectoris, unspecified: Secondary | ICD-10-CM | POA: Diagnosis not present

## 2023-06-17 LAB — ECHOCARDIOGRAM COMPLETE
Area-P 1/2: 3.81 cm2
S' Lateral: 2.8 cm

## 2023-06-18 NOTE — Progress Notes (Signed)
Echocardiogram 06/18/2023:   1. Left ventricular ejection fraction, by estimation, is 55 to 60%. Left ventricular ejection fraction by 3D volume is 55 %. The left ventricle has normal function. The left ventricle has no regional wall motion abnormalities. Left ventricular diastolic parameters were normal. The average left ventricular global longitudinal strain is -22.8 %. The global longitudinal strain is normal.  2. Right ventricular systolic function is normal. The right ventricular size is normal.  3. The mitral valve is normal in structure. No evidence of mitral valve regurgitation. No evidence of mitral stenosis.  4. The aortic valve is tricuspid. Aortic valve regurgitation is not visualized. Aortic valve sclerosis is present, with no evidence of aortic valve stenosis.  5. The inferior vena cava is normal in size with greater than 50% respiratory variability, suggesting right atrial pressure of 3 mmHg.

## 2023-06-22 NOTE — Progress Notes (Signed)
  Cardiology Office Note:  .   Date:  07/06/2023  ID:  Blake Ramirez, DOB 04/26/1956, MRN [id number] PCP: Etta Grandchild, MD   [facility] Providers Cardiologist:  Yates Decamp, MD    History of Present Illness: .   Blake Ramirez is a 67 y.o. male with history of HTN, HLD, smoker,  family history of CAD, chest pain with elevated Coronary calcium score 1153 05/12/23. Echo 05/2023 normal. NST low risk normal study, hypertensive stress response.  Patient comes in for f/u. He's changed his diet cutting out red meat and bacon. Has decreased cigarettes to 5 a day. Using nicorette gum. Using an elliptical-15 min a day.  ROS:    Studies Reviewed: Marland Kitchen         Prior CV Studies:   Echo 05/2023 Echocardiogram 06/18/2023:   1. Left ventricular ejection fraction, by estimation, is 55 to 60%. Left ventricular ejection fraction by 3D volume is 55 %. The left ventricle has normal function. The left ventricle has no regional wall motion abnormalities. Left ventricular diastolic parameters were normal. The average left ventricular global longitudinal strain is -22.8 %. The global longitudinal strain is normal.  2. Right ventricular systolic function is normal. The right ventricular size is normal.  3. The mitral valve is normal in structure. No evidence of mitral valve regurgitation. No evidence of mitral stenosis.  4. The aortic valve is tricuspid. Aortic valve regurgitation is not visualized. Aortic valve sclerosis is present, with no evidence of aortic valve stenosis.  5. The inferior vena cava is normal in size with greater than 50% respiratory variability, suggesting right atrial pressure of 3 mmHg.  NST 05/2023 The study is normal. The study is low risk.   No ST deviation was noted.   LV perfusion is normal.   Left ventricular function is normal. Nuclear stress EF: 56%. The left ventricular ejection fraction is normal (55-65%). End diastolic cavity size is normal. End systolic cavity size  is normal.   Prior study not available for comparison.   Septal perfusion defect that resolves on upright imaging.  Normal study.  Low risk.  Hypertensive stress response.    Risk Assessment/Calculations:             Physical Exam:   VS:  BP 120/62   Pulse 70   Ht 5\' 11"  (1.803 m)   Wt 161 lb 9.6 oz (73.3 kg)   SpO2 95%   BMI 22.54 kg/m    Wt Readings from Last 3 Encounters:  07/06/23 161 lb 9.6 oz (73.3 kg)  06/07/23 162 lb (73.5 kg)  05/25/23 162 lb 6.4 oz (73.7 kg)    GEN: Thin, in no acute distress NECK: No JVD; No carotid bruits CARDIAC:  RRR, no murmurs, rubs, gallops RESPIRATORY:  Decreased breath sounds,Clear to auscultation without rales, wheezing or rhonchi  ABDOMEN: Soft, non-tender, non-distended EXTREMITIES:  No edema; No deformity   ASSESSMENT AND PLAN: .    CAD with elevated coronary calcium score >1000, normal echo and NST-no further chest pain and trying to change lifestyle. Increase exercise 150 min/week  HTN-controlled on amlodipine  HLD-now on crestor and zetia, LDL 47 06/25/23  Tobacco use-trying to quit, down to 5 cigarettes daily.        Dispo: f/u in 1 yr.  Signed, Jacolyn Reedy, PA-C

## 2023-06-25 ENCOUNTER — Ambulatory Visit: Payer: Medicare Other | Attending: Cardiology

## 2023-06-25 DIAGNOSIS — E7800 Pure hypercholesterolemia, unspecified: Secondary | ICD-10-CM

## 2023-06-26 LAB — LIPID PANEL
Chol/HDL Ratio: 2.7 ratio (ref 0.0–5.0)
Cholesterol, Total: 97 mg/dL — ABNORMAL LOW (ref 100–199)
HDL: 36 mg/dL — ABNORMAL LOW (ref >39)
LDL Chol Calc (NIH): 47 mg/dL (ref 0–99)
Triglycerides: 59 mg/dL (ref 0–149)
VLDL Cholesterol Cal: 14 mg/dL (ref 5–40)

## 2023-07-06 ENCOUNTER — Ambulatory Visit: Payer: Medicare Other | Attending: Physician Assistant | Admitting: Physician Assistant

## 2023-07-06 ENCOUNTER — Encounter: Payer: Self-pay | Admitting: Physician Assistant

## 2023-07-06 VITALS — BP 120/62 | HR 70 | Ht 71.0 in | Wt 161.6 lb

## 2023-07-06 DIAGNOSIS — R931 Abnormal findings on diagnostic imaging of heart and coronary circulation: Secondary | ICD-10-CM

## 2023-07-06 DIAGNOSIS — I10 Essential (primary) hypertension: Secondary | ICD-10-CM | POA: Diagnosis not present

## 2023-07-06 DIAGNOSIS — E7800 Pure hypercholesterolemia, unspecified: Secondary | ICD-10-CM | POA: Diagnosis not present

## 2023-07-06 DIAGNOSIS — F17200 Nicotine dependence, unspecified, uncomplicated: Secondary | ICD-10-CM

## 2023-07-06 NOTE — Patient Instructions (Signed)
Medication Instructions:  Your physician recommends that you continue on your current medications as directed. Please refer to the Current Medication list given to you today.  *If you need a refill on your cardiac medications before your next appointment, please call your pharmacy*  Lab Work: None ordered today.  Testing/Procedures: None ordered today.  Follow-Up: At [facility], you and your health needs are our priority.  As part of our continuing mission to provide you with exceptional heart care, we have created designated Provider Care Teams.  These Care Teams include your primary Cardiologist (physician) and Advanced Practice Providers (APPs -  Physician Assistants and Nurse Practitioners) who all work together to provide you with the care you need, when you need it.  Your next appointment:   1 year(s)  The format for your next appointment:   In Person  Provider:   Yates Decamp, MD {  Other Instructions Your provider recommends you aim to get 150 minutes per week of moderate-intensity activity (this includes walking and swimming).

## 2023-07-07 ENCOUNTER — Other Ambulatory Visit: Payer: Self-pay

## 2023-07-07 DIAGNOSIS — Z122 Encounter for screening for malignant neoplasm of respiratory organs: Secondary | ICD-10-CM

## 2023-07-07 DIAGNOSIS — F17210 Nicotine dependence, cigarettes, uncomplicated: Secondary | ICD-10-CM

## 2023-07-07 DIAGNOSIS — Z87891 Personal history of nicotine dependence: Secondary | ICD-10-CM

## 2023-08-04 ENCOUNTER — Other Ambulatory Visit: Payer: Self-pay | Admitting: Internal Medicine

## 2023-08-04 DIAGNOSIS — E785 Hyperlipidemia, unspecified: Secondary | ICD-10-CM

## 2023-09-06 ENCOUNTER — Ambulatory Visit (INDEPENDENT_AMBULATORY_CARE_PROVIDER_SITE_OTHER): Payer: Medicare Other | Admitting: Internal Medicine

## 2023-09-06 ENCOUNTER — Encounter: Payer: Self-pay | Admitting: Internal Medicine

## 2023-09-06 VITALS — BP 142/80 | HR 76 | Temp 97.8°F | Resp 16 | Ht 71.0 in | Wt 162.8 lb

## 2023-09-06 DIAGNOSIS — E785 Hyperlipidemia, unspecified: Secondary | ICD-10-CM | POA: Diagnosis not present

## 2023-09-06 DIAGNOSIS — I700 Atherosclerosis of aorta: Secondary | ICD-10-CM

## 2023-09-06 DIAGNOSIS — I10 Essential (primary) hypertension: Secondary | ICD-10-CM

## 2023-09-06 LAB — CBC WITH DIFFERENTIAL/PLATELET
Basophils Absolute: 0.1 10*3/uL (ref 0.0–0.1)
Basophils Relative: 1.1 % (ref 0.0–3.0)
Eosinophils Absolute: 0.1 10*3/uL (ref 0.0–0.7)
Eosinophils Relative: 2.7 % (ref 0.0–5.0)
HCT: 39.7 % (ref 39.0–52.0)
Hemoglobin: 13.3 g/dL (ref 13.0–17.0)
Lymphocytes Relative: 43.3 % (ref 12.0–46.0)
Lymphs Abs: 2.1 10*3/uL (ref 0.7–4.0)
MCHC: 33.6 g/dL (ref 30.0–36.0)
MCV: 93.5 fL (ref 78.0–100.0)
Monocytes Absolute: 0.6 10*3/uL (ref 0.1–1.0)
Monocytes Relative: 11.5 % (ref 3.0–12.0)
Neutro Abs: 2 10*3/uL (ref 1.4–7.7)
Neutrophils Relative %: 41.4 % — ABNORMAL LOW (ref 43.0–77.0)
Platelets: 233 10*3/uL (ref 150.0–400.0)
RBC: 4.25 Mil/uL (ref 4.22–5.81)
RDW: 13.5 % (ref 11.5–15.5)
WBC: 4.9 10*3/uL (ref 4.0–10.5)

## 2023-09-06 LAB — HEPATIC FUNCTION PANEL
ALT: 32 U/L (ref 0–53)
AST: 24 U/L (ref 0–37)
Albumin: 4.6 g/dL (ref 3.5–5.2)
Alkaline Phosphatase: 71 U/L (ref 39–117)
Bilirubin, Direct: 0.1 mg/dL (ref 0.0–0.3)
Total Bilirubin: 0.3 mg/dL (ref 0.2–1.2)
Total Protein: 7.6 g/dL (ref 6.0–8.3)

## 2023-09-06 LAB — BASIC METABOLIC PANEL
BUN: 14 mg/dL (ref 6–23)
CO2: 29 meq/L (ref 19–32)
Calcium: 10 mg/dL (ref 8.4–10.5)
Chloride: 102 meq/L (ref 96–112)
Creatinine, Ser: 1.06 mg/dL (ref 0.40–1.50)
GFR: 73.02 mL/min (ref >60.00)
Glucose, Bld: 85 mg/dL (ref 70–99)
Potassium: 5.1 meq/L (ref 3.5–5.1)
Sodium: 137 meq/L (ref 135–145)

## 2023-09-06 MED ORDER — AMLODIPINE BESYLATE 5 MG PO TABS: 5.0000 mg | ORAL_TABLET | Freq: Every day | ORAL | 1 refills | Status: DC

## 2023-09-06 NOTE — Progress Notes (Signed)
 Subjective:  Patient ID: Blake Ramirez, male    DOB: 12-21-1955  Age: 68 y.o. MRN: [id number]  CC: Hypertension and Hyperlipidemia   HPI Anne Sebring presents for f/up ----   Discussed the use of AI scribe software for clinical note transcription with the patient, who gave verbal consent to proceed.  History of Present Illness   The patient, with a history of hypercholesterolemia, hypertension, and smoking, has been under the care of a cardiologist. He has made dietary changes, including reducing red meat intake and increasing salad consumption, which initially helped to lower his cholesterol levels. However, he admits to dietary indiscretions during the holiday season. Despite this, he denies any symptoms of hypertension such as headaches or blurred vision, and no cardiac symptoms like chest pain or shortness of breath. He successfully completed a treadmill stress test previously. He is not taking amlodipine .  The patient has been trying to quit smoking, using Nicorette and a gradual cessation approach. He denies any respiratory symptoms such as coughing, wheezing, or shortness of breath, and no peripheral edema.  He experienced a coughing reaction to a pneumonia vaccine in the past but is not currently using any inhalers. He has also made changes to his breakfast routine, favoring oatmeal over previous choices of scrambled eggs with cheese and bacon.  The patient has been taking ezetimibe  for cholesterol management, which has been effective in maintaining good cholesterol levels. He also monitors his blood pressure at home and has previously provided the doctor with a list of readings. However, during this visit, his blood pressure readings were elevated.       Outpatient Medications Prior to Visit  Medication Sig Dispense Refill   aspirin 81 MG chewable tablet Chew 81 mg by mouth daily.     cetirizine  (ZYRTEC ) 10 MG tablet Take 1 tablet (10 mg total) by mouth daily. 90 tablet 1    ezetimibe  (ZETIA ) 10 MG tablet Take 1 tablet (10 mg total) by mouth daily. 90 tablet 3   rosuvastatin  (CRESTOR ) 20 MG tablet TAKE 1 TABLET BY MOUTH EVERY DAY 90 tablet 0   amLODipine  (NORVASC ) 5 MG tablet TAKE 1 TABLET (5 MG TOTAL) BY MOUTH DAILY. 90 tablet 0   No facility-administered medications prior to visit.    ROS Review of Systems  Constitutional:  Negative for diaphoresis and fatigue.  HENT: Negative.  Negative for trouble swallowing.   Eyes: Negative.   Respiratory: Negative.  Negative for cough, chest tightness, shortness of breath and wheezing.   Cardiovascular:  Negative for chest pain, palpitations and leg swelling.  Gastrointestinal:  Negative for abdominal pain, constipation, diarrhea, nausea and vomiting.  Endocrine: Negative.   Genitourinary: Negative.  Negative for difficulty urinating.  Musculoskeletal: Negative.   Skin: Negative.   Neurological: Negative.  Negative for dizziness and weakness.  Hematological:  Negative for adenopathy. Does not bruise/bleed easily.  Psychiatric/Behavioral: Negative.      Objective:  BP (!) 142/80 (BP Location: Left Arm, Patient Position: Sitting, Cuff Size: Normal)   Pulse 76   Temp 97.8 F (36.6 C) (Oral)   Resp 16   Ht 5' 11 (1.803 m)   Wt 162 lb 12.8 oz (73.8 kg)   SpO2 95%   BMI 22.71 kg/m   BP Readings from Last 3 Encounters:  09/06/23 (!) 142/80  07/06/23 120/62  05/25/23 138/70    Wt Readings from Last 3 Encounters:  09/06/23 162 lb 12.8 oz (73.8 kg)  07/06/23 161 lb 9.6 oz (73.3 kg)  06/07/23  162 lb (73.5 kg)    Physical Exam Vitals reviewed.  Constitutional:      Appearance: Normal appearance.  HENT:     Nose: Nose normal.     Mouth/Throat:     Mouth: Mucous membranes are moist.  Eyes:     General: No scleral icterus.    Conjunctiva/sclera: Conjunctivae normal.  Cardiovascular:     Rate and Rhythm: Normal rate and regular rhythm.     Heart sounds: No murmur heard.    No friction rub. No  gallop.  Pulmonary:     Effort: Pulmonary effort is normal.     Breath sounds: No stridor. No wheezing, rhonchi or rales.  Abdominal:     General: Abdomen is flat.     Palpations: There is no mass.     Tenderness: There is no abdominal tenderness. There is no guarding.     Hernia: No hernia is present.  Musculoskeletal:        General: Normal range of motion.     Cervical back: Neck supple.     Right lower leg: No edema.     Left lower leg: No edema.  Lymphadenopathy:     Cervical: No cervical adenopathy.  Skin:    General: Skin is warm and dry.  Neurological:     General: No focal deficit present.     Mental Status: He is alert. Mental status is at baseline.  Psychiatric:        Mood and Affect: Mood normal.        Behavior: Behavior normal.     Lab Results  Component Value Date   WBC 4.9 09/06/2023   HGB 13.3 09/06/2023   HCT 39.7 09/06/2023   PLT 233.0 09/06/2023   GLUCOSE 85 09/06/2023   CHOL 97 (L) 06/25/2023   TRIG 59 06/25/2023   HDL 36 (L) 06/25/2023   LDLDIRECT 160.0 02/25/2022   LDLCALC 47 06/25/2023   ALT 32 09/06/2023   AST 24 09/06/2023   NA 137 09/06/2023   K 5.1 09/06/2023   CL 102 09/06/2023   CREATININE 1.06 09/06/2023   BUN 14 09/06/2023   CO2 29 09/06/2023   TSH 1.92 03/04/2023   PSA 0.89 03/04/2023    CT CHEST LUNG CA SCREEN LOW DOSE W/O CM Result Date: 07/06/2023 CLINICAL DATA:  68 year old male current smoker with 21 pack-year smoking history EXAM: CT CHEST WITHOUT CONTRAST LOW-DOSE FOR LUNG CANCER SCREENING TECHNIQUE: Multidetector CT imaging of the chest was performed following the standard protocol without IV contrast. RADIATION DOSE REDUCTION: This exam was performed according to the departmental dose-optimization program which includes automated exposure control, adjustment of the mA and/or kV according to patient size and/or use of iterative reconstruction technique. COMPARISON:  06/10/2022 screening chest CT.  05/12/2023 coronary CT  FINDINGS: Cardiovascular: Normal heart size. No significant pericardial effusion/thickening. Left anterior descending and right coronary atherosclerosis. Atherosclerotic nonaneurysmal thoracic aorta. Normal caliber pulmonary arteries. Mediastinum/Nodes: No significant thyroid  nodules. Unremarkable esophagus. No pathologically enlarged axillary, mediastinal or hilar lymph nodes, noting limited sensitivity for the detection of hilar adenopathy on this noncontrast study. Lungs/Pleura: No pneumothorax. No pleural effusion. Mild paraseptal and centrilobular emphysema with diffuse bronchial wall thickening. No acute consolidative airspace disease or lung masses. No significant pulmonary nodules. Upper abdomen: No acute abnormality. Musculoskeletal:  No aggressive appearing focal osseous lesions. IMPRESSION: 1. Lung-RADS 1, negative. Continue annual screening with low-dose chest CT without contrast in 12 months. 2. Two-vessel coronary atherosclerosis. 3. Aortic Atherosclerosis (ICD10-I70.0) and Emphysema (ICD10-J43.9).  Electronically Signed   By: Selinda DELENA Blue M.D.   On: 07/06/2023 17:38    Assessment & Plan:  Primary hypertension- He has not achieved his BP goal. Will restart the CCB. -     Basic metabolic panel; Future -     CBC with Differential/Platelet; Future -     Hepatic function panel; Future -     Urinalysis, Routine w reflex microscopic; Future -     amLODIPine  Besylate; Take 1 tablet (5 mg total) by mouth daily.  Dispense: 90 tablet; Refill: 1  Hyperlipidemia LDL goal <100- LDL goal achieved. Doing well on the statin  -     Hepatic function panel; Future  Atherosclerosis of aorta (HCC)- Risk factor modifications addressed.     Follow-up: Return in about 6 months (around 03/05/2024).  Debby Molt, MD

## 2023-09-06 NOTE — Patient Instructions (Signed)
 Hypertension, Adult High blood pressure (hypertension) is when the force of blood pumping through the arteries is too strong. The arteries are the blood vessels that carry blood from the heart throughout the body. Hypertension forces the heart to work harder to pump blood and may cause arteries to become narrow or stiff. Untreated or uncontrolled hypertension can lead to a heart attack, heart failure, a stroke, kidney disease, and other problems. A blood pressure reading consists of a higher number over a lower number. Ideally, your blood pressure should be below 120/80. The first ("top") number is called the systolic pressure. It is a measure of the pressure in your arteries as your heart beats. The second ("bottom") number is called the diastolic pressure. It is a measure of the pressure in your arteries as the heart relaxes. What are the causes? The exact cause of this condition is not known. There are some conditions that result in high blood pressure. What increases the risk? Certain factors may make you more likely to develop high blood pressure. Some of these risk factors are under your control, including: Smoking. Not getting enough exercise or physical activity. Being overweight. Having too much fat, sugar, calories, or salt (sodium) in your diet. Drinking too much alcohol. Other risk factors include: Having a personal history of heart disease, diabetes, high cholesterol, or kidney disease. Stress. Having a family history of high blood pressure and high cholesterol. Having obstructive sleep apnea. Age. The risk increases with age. What are the signs or symptoms? High blood pressure may not cause symptoms. Very high blood pressure (hypertensive crisis) may cause: Headache. Fast or irregular heartbeats (palpitations). Shortness of breath. Nosebleed. Nausea and vomiting. Vision changes. Severe chest pain, dizziness, and seizures. How is this diagnosed? This condition is diagnosed by  measuring your blood pressure while you are seated, with your arm resting on a flat surface, your legs uncrossed, and your feet flat on the floor. The cuff of the blood pressure monitor will be placed directly against the skin of your upper arm at the level of your heart. Blood pressure should be measured at least twice using the same arm. Certain conditions can cause a difference in blood pressure between your right and left arms. If you have a high blood pressure reading during one visit or you have normal blood pressure with other risk factors, you may be asked to: Return on a different day to have your blood pressure checked again. Monitor your blood pressure at home for 1 week or longer. If you are diagnosed with hypertension, you may have other blood or imaging tests to help your health care provider understand your overall risk for other conditions. How is this treated? This condition is treated by making healthy lifestyle changes, such as eating healthy foods, exercising more, and reducing your alcohol intake. You may be referred for counseling on a healthy diet and physical activity. Your health care provider may prescribe medicine if lifestyle changes are not enough to get your blood pressure under control and if: Your systolic blood pressure is above 130. Your diastolic blood pressure is above 80. Your personal target blood pressure may vary depending on your medical conditions, your age, and other factors. Follow these instructions at home: Eating and drinking  Eat a diet that is high in fiber and potassium, and low in sodium, added sugar, and fat. An example of this eating plan is called the DASH diet. DASH stands for Dietary Approaches to Stop Hypertension. To eat this way: Eat  plenty of fresh fruits and vegetables. Try to fill one half of your plate at each meal with fruits and vegetables. Eat whole grains, such as whole-wheat pasta, brown rice, or whole-grain bread. Fill about one  fourth of your plate with whole grains. Eat or drink low-fat dairy products, such as skim milk or low-fat yogurt. Avoid fatty cuts of meat, processed or cured meats, and poultry with skin. Fill about one fourth of your plate with lean proteins, such as fish, chicken without skin, beans, eggs, or tofu. Avoid pre-made and processed foods. These tend to be higher in sodium, added sugar, and fat. Reduce your daily sodium intake. Many people with hypertension should eat less than 1,500 mg of sodium a day. Do not drink alcohol if: Your health care provider tells you not to drink. You are pregnant, may be pregnant, or are planning to become pregnant. If you drink alcohol: Limit how much you have to: 0-1 drink a day for women. 0-2 drinks a day for men. Know how much alcohol is in your drink. In the [place] one drink equals one 12 oz bottle of beer (355 mL), one 5 oz glass of wine (148 mL), or one 1 oz glass of hard liquor (44 mL). Lifestyle  Work with your health care provider to maintain a healthy body weight or to lose weight. Ask what an ideal weight is for you. Get at least 30 minutes of exercise that causes your heart to beat faster (aerobic exercise) most days of the week. Activities may include walking, swimming, or biking. Include exercise to strengthen your muscles (resistance exercise), such as Pilates or lifting weights, as part of your weekly exercise routine. Try to do these types of exercises for 30 minutes at least 3 days a week. Do not use any products that contain nicotine or tobacco. These products include cigarettes, chewing tobacco, and vaping devices, such as e-cigarettes. If you need help quitting, ask your health care provider. Monitor your blood pressure at home as told by your health care provider. Keep all follow-up visits. This is important. Medicines Take over-the-counter and prescription medicines only as told by your health care provider. Follow directions carefully. Blood  pressure medicines must be taken as prescribed. Do not skip doses of blood pressure medicine. Doing this puts you at risk for problems and can make the medicine less effective. Ask your health care provider about side effects or reactions to medicines that you should watch for. Contact a health care provider if you: Think you are having a reaction to a medicine you are taking. Have headaches that keep coming back (recurring). Feel dizzy. Have swelling in your ankles. Have trouble with your vision. Get help right away if you: Develop a severe headache or confusion. Have unusual weakness or numbness. Feel faint. Have severe pain in your chest or abdomen. Vomit repeatedly. Have trouble breathing. These symptoms may be an emergency. Get help right away. Call 911. Do not wait to see if the symptoms will go away. Do not drive yourself to the hospital. Summary Hypertension is when the force of blood pumping through your arteries is too strong. If this condition is not controlled, it may put you at risk for serious complications. Your personal target blood pressure may vary depending on your medical conditions, your age, and other factors. For most people, a normal blood pressure is less than 120/80. Hypertension is treated with lifestyle changes, medicines, or a combination of both. Lifestyle changes include losing weight, eating a healthy,  low-sodium diet, exercising more, and limiting alcohol. This information is not intended to replace advice given to you by your health care provider. Make sure you discuss any questions you have with your health care provider. Document Revised: 06/17/2021 Document Reviewed: 06/17/2021 Elsevier Patient Education  2024 [employer].

## 2023-09-07 LAB — URINALYSIS, ROUTINE W REFLEX MICROSCOPIC
Bilirubin Urine: NEGATIVE
Hgb urine dipstick: NEGATIVE
Ketones, ur: NEGATIVE
Leukocytes,Ua: NEGATIVE
Nitrite: NEGATIVE
RBC / HPF: NONE SEEN (ref 0–?)
Specific Gravity, Urine: 1.025 (ref 1.000–1.030)
Total Protein, Urine: NEGATIVE
Urine Glucose: NEGATIVE
Urobilinogen, UA: 0.2 (ref 0.0–1.0)
pH: 6 (ref 5.0–8.0)

## 2023-10-30 ENCOUNTER — Other Ambulatory Visit: Payer: Self-pay | Admitting: Internal Medicine

## 2023-10-30 DIAGNOSIS — E785 Hyperlipidemia, unspecified: Secondary | ICD-10-CM

## 2023-11-08 ENCOUNTER — Ambulatory Visit (INDEPENDENT_AMBULATORY_CARE_PROVIDER_SITE_OTHER)

## 2023-11-08 VITALS — BP 138/78 | Ht 71.5 in | Wt 159.4 lb

## 2023-11-08 DIAGNOSIS — Z122 Encounter for screening for malignant neoplasm of respiratory organs: Secondary | ICD-10-CM

## 2023-11-08 DIAGNOSIS — Z0000 Encounter for general adult medical examination without abnormal findings: Secondary | ICD-10-CM

## 2023-11-08 DIAGNOSIS — F17200 Nicotine dependence, unspecified, uncomplicated: Secondary | ICD-10-CM

## 2023-11-08 NOTE — Patient Instructions (Signed)
 Blake Ramirez , Thank you for taking time to come for your Medicare Wellness Visit. I appreciate your ongoing commitment to your health goals. Please review the following plan we discussed and let me know if I can assist you in the future.   Referrals/Orders/Follow-Ups/Clinician Recommendations: Aim for 30 minutes of exercise or brisk walking, 6-8 glasses of water, and 5 servings of fruits and vegetables each day.   This is a list of the screening recommended for you and due dates:  Health Maintenance  Topic Date Due   Zoster (Shingles) Vaccine (1 of 2) Never done   Screening for Lung Cancer  06/13/2024   Medicare Annual Wellness Visit  11/07/2024   Cologuard (Stool DNA test)  03/09/2025   DTaP/Tdap/Td vaccine (2 - Td or Tdap) 03/09/2033   Pneumonia Vaccine  Completed   Hepatitis C Screening  Completed   HPV Vaccine  Aged Out   Flu Shot  Discontinued   COVID-19 Vaccine  Discontinued    Advanced directives: (Provided) Advance directive discussed with you today. I have provided a copy for you to complete at home and have notarized. Once this is complete, please bring a copy in to our office so we can scan it into your chart.   Next Medicare Annual Wellness Visit scheduled for next year: Yes

## 2023-11-08 NOTE — Progress Notes (Signed)
 Subjective:   Blake Ramirez is a 68 y.o. who presents for a Medicare Wellness preventive visit.  Visit Complete: In person   Persons Participating in Visit: Patient.  AWV Questionnaire: No: Patient Medicare AWV questionnaire was not completed prior to this visit.  Cardiac Risk Factors include: advanced age (>45men, >5 women);hypertension;dyslipidemia;smoking/ tobacco exposure;male gender     Objective:    Today's Vitals   11/08/23 1000  BP: 138/78  Weight: 159 lb 6.4 oz (72.3 kg)  Height: 5' 11.5" (1.816 m)   Body mass index is 21.92 kg/m.     11/08/2023    9:58 AM  Advanced Directives  Does Patient Have a Medical Advance Directive? No  Would patient like information on creating a medical advance directive? Yes (MAU/Ambulatory/Procedural Areas - Information given)    Current Medications (verified) Outpatient Encounter Medications as of 11/08/2023  Medication Sig   amLODipine (NORVASC) 5 MG tablet Take 1 tablet (5 mg total) by mouth daily.   aspirin 81 MG chewable tablet Chew 81 mg by mouth daily.   cetirizine (ZYRTEC) 10 MG tablet Take 1 tablet (10 mg total) by mouth daily.   rosuvastatin (CRESTOR) 20 MG tablet TAKE 1 TABLET BY MOUTH EVERY DAY   [DISCONTINUED] ezetimibe (ZETIA) 10 MG tablet Take 1 tablet (10 mg total) by mouth daily.   No facility-administered encounter medications on file as of 11/08/2023.    Allergies (verified) Patient has no known allergies.   History: Past Medical History:  Diagnosis Date   Allergy    Atherosclerosis of aorta (HCC)    Benign prostatic hyperplasia without lower urinary tract symptoms    Bronchitis    GERD (gastroesophageal reflux disease)    Hypercalcemia    Hyperlipidemia    Hypertension    Vitamin D insufficiency    History reviewed. No pertinent surgical history. Family History  Problem Relation Age of Onset   Hypertension Mother    Prostate cancer Father    Intellectual disability Sister    Hypertension  Sister    Hyperlipidemia Sister    Diabetes Sister    Depression Sister    Arthritis Sister    Pancreatic cancer Sister    Hypertension Brother    Hyperlipidemia Brother    Diabetes Brother    Arthritis Brother    Early death Brother        COVID   Social History   Socioeconomic History   Marital status: Divorced    Spouse name: Not on file   Number of children: Not on file   Years of education: Not on file   Highest education level: Some college, no degree  Occupational History   Not on file  Tobacco Use   Smoking status: Some Days    Current packs/day: 1.00    Average packs/day: 1 pack/day for 41.2 years (41.2 ttl pk-yrs)    Types: Cigarettes    Start date: 1984    Passive exposure: Current   Smokeless tobacco: Never   Tobacco comments:    Currently down to 4 cigarettes daily  Vaping Use   Vaping status: Never Used  Substance and Sexual Activity   Alcohol use: Yes    Alcohol/week: 1.0 standard drink of alcohol    Types: 1 Cans of beer per week    Comment: occ   Drug use: No   Sexual activity: Yes    Partners: Female  Other Topics Concern   Not on file  Social History Narrative   Not on file  Social Drivers of Corporate investment banker Strain: Low Risk  (11/08/2023)   Overall Financial Resource Strain (CARDIA)    Difficulty of Paying Living Expenses: Not hard at all  Food Insecurity: No Food Insecurity (11/08/2023)   Hunger Vital Sign    Worried About Running Out of Food in the Last Year: Never true    Ran Out of Food in the Last Year: Never true  Transportation Needs: No Transportation Needs (11/08/2023)   PRAPARE - Administrator, Civil Service (Medical): No    Lack of Transportation (Non-Medical): No  Physical Activity: Insufficiently Active (11/08/2023)   Exercise Vital Sign    Days of Exercise per Week: 3 days    Minutes of Exercise per Session: 30 min  Stress: No Stress Concern Present (11/08/2023)   [employer] of Occupational  Health - Occupational Stress Questionnaire    Feeling of Stress : Not at all  Social Connections: Socially Isolated (11/08/2023)   Social Connection and Isolation Panel [NHANES]    Frequency of Communication with Friends and Family: More than three times a week    Frequency of Social Gatherings with Friends and Family: More than three times a week    Attends Religious Services: Never    Database administrator or Organizations: No    Attends Banker Meetings: Never    Marital Status: Divorced    Tobacco Counseling - Current Smoker Ready to quit: Yes Counseling given: Yes Tobacco comments: Currently down to 4 cigarettes daily Ordered a Lung Cancer Screening test for 05/2024 (>69yrs).     Clinical Intake:  Pre-visit preparation completed: Yes  Pain : No/denies pain     BMI - recorded: 21.92 Nutritional Status: BMI of 19-24  Normal Nutritional Risks: None Diabetes: No  How often do you need to have someone help you when you read instructions, pamphlets, or other written materials from your doctor or pharmacy?: 1 - Never  Interpreter Needed?: No  Information entered by :: Hassell Halim, CMA   Activities of Daily Living     11/08/2023   10:02 AM  In your present state of health, do you have any difficulty performing the following activities:  Hearing? 0  Vision? 0  Difficulty concentrating or making decisions? 0  Walking or climbing stairs? 0  Dressing or bathing? 0  Doing errands, shopping? 0  Preparing Food and eating ? N  Using the Toilet? N  In the past six months, have you accidently leaked urine? N  Do you have problems with loss of bowel control? N  Managing your Medications? N  Managing your Finances? N  Housekeeping or managing your Housekeeping? N    Patient Care Team: Etta Grandchild, MD as PCP - General (Internal Medicine) Yates Decamp, MD as PCP - Cardiology (Cardiology)  Indicate any recent Medical Services you may have received from  other than [facility] providers in the past year (date may be approximate).     Assessment:   This is a routine wellness examination for Blake Ramirez.  Hearing/Vision screen Hearing Screening - Comments:: Denies hearing difficulties   Vision Screening - Comments:: Wears rx glasses - up to date with routine eye exams with [facility]   Goals Addressed               This Visit's Progress     Patient Stated (pt-stated)        Patient stated he plans to watch diet and reduce smoking.  Depression Screen     11/08/2023   10:07 AM 09/06/2023    1:46 PM 03/04/2023    1:51 PM 02/25/2022    2:37 PM  PHQ 2/9 Scores  PHQ - 2 Score 0 0 0 0  PHQ- 9 Score 0  0 0    Fall Risk     11/08/2023   10:08 AM 09/06/2023    1:45 PM  Fall Risk   Falls in the past year? 0 0  Number falls in past yr: 0 0  Injury with Fall? 0 0  Risk for fall due to : No Fall Risks No Fall Risks  Follow up Falls prevention discussed;Falls evaluation completed Falls evaluation completed    MEDICARE RISK AT HOME:  Medicare Risk at Home Any stairs in or around the home?: No If so, are there any without handrails?: No Home free of loose throw rugs in walkways, pet beds, electrical cords, etc?: Yes Adequate lighting in your home to reduce risk of falls?: Yes Life alert?: No Use of a cane, walker or w/c?: No Grab bars in the bathroom?: No Shower chair or bench in shower?: No Elevated toilet seat or a handicapped toilet?: Yes  TIMED UP AND GO:  Was the test performed?  No  Cognitive Function: 6CIT completed        11/08/2023   10:09 AM  6CIT Screen  What Year? 0 points  What month? 0 points  What time? 0 points  Count back from 20 0 points  Months in reverse 0 points  Repeat phrase 0 points  Total Score 0 points    Immunizations Immunization History  Administered Date(s) Administered   PNEUMOCOCCAL CONJUGATE-20 05/04/2022   Td (Adult),5 Lf Tetanus Toxid, Preservative Free 03/08/2023   Tdap  03/10/2023    Screening Tests Health Maintenance  Topic Date Due   Zoster Vaccines- Shingrix (1 of 2) Never done   Lung Cancer Screening  06/13/2024   Medicare Annual Wellness (AWV)  11/07/2024   Fecal DNA (Cologuard)  03/09/2025   DTaP/Tdap/Td (2 - Td or Tdap) 03/09/2033   Pneumonia Vaccine 51+ Years old  Completed   Hepatitis C Screening  Completed   HPV VACCINES  Aged Out   INFLUENZA VACCINE  Discontinued   COVID-19 Vaccine  Discontinued    Health Maintenance  Health Maintenance Due  Topic Date Due   Zoster Vaccines- Shingrix (1 of 2) Never done   Health Maintenance Items Addressed: 11/08/2023   Additional Screening:  Vision Screening: Recommended annual ophthalmology exams for early detection of glaucoma and other disorders of the eye. Pt stated gets routine eye care at [facility] and due in 2025.  Dental Screening: Recommended annual dental exams for proper oral hygiene  Community Resource Referral / Chronic Care Management: CRR required this visit?  No   CCM required this visit?  No     Plan:     I have personally reviewed and noted the following in the patient's chart:   Medical and social history Use of alcohol, tobacco or illicit drugs  Current medications and supplements including opioid prescriptions. Patient is not currently taking opioid prescriptions. Functional ability and status Nutritional status Physical activity Advanced directives List of other physicians Hospitalizations, surgeries, and ER visits in previous 12 months Vitals Screenings to include cognitive, depression, and falls Referrals and appointments  In addition, I have reviewed and discussed with patient certain preventive protocols, quality metrics, and best practice recommendations. A written personalized care plan for preventive  services as well as general preventive health recommendations were provided to patient.     Darreld Mclean, CMA   11/08/2023   After Visit  Summary: (MyChart) Due to this being a telephonic visit, the after visit summary with patients personalized plan was offered to patient via MyChart   Notes: Nothing significant to report at this time.

## 2024-03-02 ENCOUNTER — Other Ambulatory Visit: Payer: Self-pay | Admitting: Internal Medicine

## 2024-03-02 DIAGNOSIS — I10 Essential (primary) hypertension: Secondary | ICD-10-CM

## 2024-03-06 ENCOUNTER — Ambulatory Visit: Payer: Medicare Other | Admitting: Internal Medicine

## 2024-03-06 ENCOUNTER — Encounter: Payer: Self-pay | Admitting: Internal Medicine

## 2024-03-06 VITALS — BP 128/86 | HR 77 | Temp 98.5°F | Resp 16 | Ht 71.5 in | Wt 158.8 lb

## 2024-03-06 DIAGNOSIS — R001 Bradycardia, unspecified: Secondary | ICD-10-CM | POA: Diagnosis not present

## 2024-03-06 DIAGNOSIS — Z0001 Encounter for general adult medical examination with abnormal findings: Secondary | ICD-10-CM

## 2024-03-06 DIAGNOSIS — J410 Simple chronic bronchitis: Secondary | ICD-10-CM | POA: Diagnosis not present

## 2024-03-06 DIAGNOSIS — Z23 Encounter for immunization: Secondary | ICD-10-CM | POA: Diagnosis not present

## 2024-03-06 DIAGNOSIS — Z0000 Encounter for general adult medical examination without abnormal findings: Secondary | ICD-10-CM

## 2024-03-06 DIAGNOSIS — E875 Hyperkalemia: Secondary | ICD-10-CM

## 2024-03-06 DIAGNOSIS — N400 Enlarged prostate without lower urinary tract symptoms: Secondary | ICD-10-CM

## 2024-03-06 DIAGNOSIS — E785 Hyperlipidemia, unspecified: Secondary | ICD-10-CM | POA: Diagnosis not present

## 2024-03-06 DIAGNOSIS — I10 Essential (primary) hypertension: Secondary | ICD-10-CM | POA: Diagnosis not present

## 2024-03-06 LAB — BASIC METABOLIC PANEL WITH GFR
BUN: 15 mg/dL (ref 6–23)
CO2: 26 meq/L (ref 19–32)
Calcium: 10.2 mg/dL (ref 8.4–10.5)
Chloride: 102 meq/L (ref 96–112)
Creatinine, Ser: 1.19 mg/dL (ref 0.40–1.50)
GFR: 63.34 mL/min (ref >60.00)
Glucose, Bld: 92 mg/dL (ref 70–99)
Potassium: 5.5 meq/L — ABNORMAL HIGH (ref 3.5–5.1)
Sodium: 136 meq/L (ref 135–145)

## 2024-03-06 LAB — CBC WITH DIFFERENTIAL/PLATELET
Basophils Absolute: 0.1 K/uL (ref 0.0–0.1)
Basophils Relative: 1 % (ref 0.0–3.0)
Eosinophils Absolute: 0.1 K/uL (ref 0.0–0.7)
Eosinophils Relative: 2 % (ref 0.0–5.0)
HCT: 42.2 % (ref 39.0–52.0)
Hemoglobin: 14.2 g/dL (ref 13.0–17.0)
Lymphocytes Relative: 37.9 % (ref 12.0–46.0)
Lymphs Abs: 2.2 K/uL (ref 0.7–4.0)
MCHC: 33.7 g/dL (ref 30.0–36.0)
MCV: 92.1 fl (ref 78.0–100.0)
Monocytes Absolute: 0.6 K/uL (ref 0.1–1.0)
Monocytes Relative: 9.8 % (ref 3.0–12.0)
Neutro Abs: 2.9 K/uL (ref 1.4–7.7)
Neutrophils Relative %: 49.3 % (ref 43.0–77.0)
Platelets: 237 K/uL (ref 150.0–400.0)
RBC: 4.58 Mil/uL (ref 4.22–5.81)
RDW: 13.6 % (ref 11.5–15.5)
WBC: 5.8 K/uL (ref 4.0–10.5)

## 2024-03-06 LAB — HEPATIC FUNCTION PANEL
ALT: 19 U/L (ref 0–53)
AST: 20 U/L (ref 0–37)
Albumin: 4.9 g/dL (ref 3.5–5.2)
Alkaline Phosphatase: 82 U/L (ref 39–117)
Bilirubin, Direct: 0.1 mg/dL (ref 0.0–0.3)
Total Bilirubin: 0.4 mg/dL (ref 0.2–1.2)
Total Protein: 7.9 g/dL (ref 6.0–8.3)

## 2024-03-06 LAB — URINALYSIS, ROUTINE W REFLEX MICROSCOPIC
Bilirubin Urine: NEGATIVE
Hgb urine dipstick: NEGATIVE
Ketones, ur: NEGATIVE
Leukocytes,Ua: NEGATIVE
Nitrite: NEGATIVE
RBC / HPF: NONE SEEN (ref 0–?)
Specific Gravity, Urine: 1.01 (ref 1.000–1.030)
Total Protein, Urine: NEGATIVE
Urine Glucose: NEGATIVE
Urobilinogen, UA: 0.2 (ref 0.0–1.0)
pH: 6 (ref 5.0–8.0)

## 2024-03-06 MED ORDER — SHINGRIX 50 MCG/0.5ML IM SUSR: 0.5000 mL | Freq: Once | INTRAMUSCULAR | 1 refills | Status: AC

## 2024-03-06 NOTE — Progress Notes (Unsigned)
 Subjective:  Patient ID: Lecil Tapp, male    DOB: 02/17/1956  Age: 68 y.o. MRN: [id number]  CC: Annual Exam, Hypertension, COPD, Hyperlipidemia, and Coronary Artery Disease   HPI Travonte Byard presents for a CPX and f/up -----  Outpatient Medications Prior to Visit  Medication Sig Dispense Refill   amLODipine  (NORVASC ) 5 MG tablet Take 1 tablet (5 mg total) by mouth daily. 90 tablet 1   aspirin 81 MG chewable tablet Chew 81 mg by mouth daily.     cetirizine  (ZYRTEC ) 10 MG tablet Take 1 tablet (10 mg total) by mouth daily. 90 tablet 1   ezetimibe  (ZETIA ) 10 MG tablet Take 10 mg by mouth daily.     rosuvastatin  (CRESTOR ) 20 MG tablet TAKE 1 TABLET BY MOUTH EVERY DAY 90 tablet 1   No facility-administered medications prior to visit.    ROS Review of Systems  Objective:  BP 128/86 (BP Location: Left Arm, Patient Position: Sitting, Cuff Size: Normal)   Pulse 77   Temp 98.5 F (36.9 C) (Oral)   Resp 16   Ht 5' 11.5 (1.816 m)   Wt 158 lb 12.8 oz (72 kg)   SpO2 98%   BMI 21.84 kg/m   BP Readings from Last 3 Encounters:  03/06/24 128/86  11/08/23 138/78  09/06/23 (!) 142/80    Wt Readings from Last 3 Encounters:  03/06/24 158 lb 12.8 oz (72 kg)  11/08/23 159 lb 6.4 oz (72.3 kg)  09/06/23 162 lb 12.8 oz (73.8 kg)    Physical Exam  Lab Results  Component Value Date   WBC 4.9 09/06/2023   HGB 13.3 09/06/2023   HCT 39.7 09/06/2023   PLT 233.0 09/06/2023   GLUCOSE 85 09/06/2023   CHOL 97 (L) 06/25/2023   TRIG 59 06/25/2023   HDL 36 (L) 06/25/2023   LDLDIRECT 160.0 02/25/2022   LDLCALC 47 06/25/2023   ALT 32 09/06/2023   AST 24 09/06/2023   NA 137 09/06/2023   K 5.1 09/06/2023   CL 102 09/06/2023   CREATININE 1.06 09/06/2023   BUN 14 09/06/2023   CO2 29 09/06/2023   TSH 1.92 03/04/2023   PSA 0.89 03/04/2023    CT CHEST LUNG CA SCREEN LOW DOSE W/O CM Result Date: 07/06/2023 CLINICAL DATA:  68 year old male current smoker with 21 pack-year  smoking history EXAM: CT CHEST WITHOUT CONTRAST LOW-DOSE FOR LUNG CANCER SCREENING TECHNIQUE: Multidetector CT imaging of the chest was performed following the standard protocol without IV contrast. RADIATION DOSE REDUCTION: This exam was performed according to the departmental dose-optimization program which includes automated exposure control, adjustment of the mA and/or kV according to patient size and/or use of iterative reconstruction technique. COMPARISON:  06/10/2022 screening chest CT.  05/12/2023 coronary CT FINDINGS: Cardiovascular: Normal heart size. No significant pericardial effusion/thickening. Left anterior descending and right coronary atherosclerosis. Atherosclerotic nonaneurysmal thoracic aorta. Normal caliber pulmonary arteries. Mediastinum/Nodes: No significant thyroid  nodules. Unremarkable esophagus. No pathologically enlarged axillary, mediastinal or hilar lymph nodes, noting limited sensitivity for the detection of hilar adenopathy on this noncontrast study. Lungs/Pleura: No pneumothorax. No pleural effusion. Mild paraseptal and centrilobular emphysema with diffuse bronchial wall thickening. No acute consolidative airspace disease or lung masses. No significant pulmonary nodules. Upper abdomen: No acute abnormality. Musculoskeletal:  No aggressive appearing focal osseous lesions. IMPRESSION: 1. Lung-RADS 1, negative. Continue annual screening with low-dose chest CT without contrast in 12 months. 2. Two-vessel coronary atherosclerosis. 3. Aortic Atherosclerosis (ICD10-I70.0) and Emphysema (ICD10-J43.9). Electronically Signed   By: Selinda  A Poff M.D.   On: 07/06/2023 17:38    Assessment & Plan:  Need for prophylactic vaccination and inoculation against varicella -     Shingrix ; Inject 0.5 mLs into the muscle once for 1 dose.  Dispense: 0.5 mL; Refill: 1  Primary hypertension -     TSH; Future -     CBC with Differential/Platelet; Future -     Urinalysis, Routine w reflex microscopic;  Future -     Hepatic function panel; Future -     Basic metabolic panel with GFR; Future -     EKG 12-Lead  Encounter for general adult medical examination with abnormal findings  Benign prostatic hyperplasia without lower urinary tract symptoms -     PSA; Future -     Urinalysis, Routine w reflex microscopic; Future  Hyperlipidemia LDL goal <100 -     TSH; Future  Bradycardia -     EKG 12-Lead     Follow-up: No follow-ups on file.  Debby Molt, MD

## 2024-03-06 NOTE — Patient Instructions (Signed)
 Health Maintenance, Male  Adopting a healthy lifestyle and getting preventive care are important in promoting health and wellness. Ask your health care provider about:  The right schedule for you to have regular tests and exams.  Things you can do on your own to prevent diseases and keep yourself healthy.  What should I know about diet, weight, and exercise?  Eat a healthy diet    Eat a diet that includes plenty of vegetables, fruits, low-fat dairy products, and lean protein.  Do not eat a lot of foods that are high in solid fats, added sugars, or sodium.  Maintain a healthy weight  Body mass index (BMI) is a measurement that can be used to identify possible weight problems. It estimates body fat based on height and weight. Your health care provider can help determine your BMI and help you achieve or maintain a healthy weight.  Get regular exercise  Get regular exercise. This is one of the most important things you can do for your health. Most adults should:  Exercise for at least 150 minutes each week. The exercise should increase your heart rate and make you sweat (moderate-intensity exercise).  Do strengthening exercises at least twice a week. This is in addition to the moderate-intensity exercise.  Spend less time sitting. Even light physical activity can be beneficial.  Watch cholesterol and blood lipids  Have your blood tested for lipids and cholesterol at 67 years of age, then have this test every 5 years.  You may need to have your cholesterol levels checked more often if:  Your lipid or cholesterol levels are high.  You are older than 67 years of age.  You are at high risk for heart disease.  What should I know about cancer screening?  Many types of cancers can be detected early and may often be prevented. Depending on your health history and family history, you may need to have cancer screening at various ages. This may include screening for:  Colorectal cancer.  Prostate cancer.  Skin cancer.  Lung  cancer.  What should I know about heart disease, diabetes, and high blood pressure?  Blood pressure and heart disease  High blood pressure causes heart disease and increases the risk of stroke. This is more likely to develop in people who have high blood pressure readings or are overweight.  Talk with your health care provider about your target blood pressure readings.  Have your blood pressure checked:  Every 3-5 years if you are 9-95 years of age.  Every year if you are 85 years old or older.  If you are between the ages of 29 and 29 and are a current or former smoker, ask your health care provider if you should have a one-time screening for abdominal aortic aneurysm (AAA).  Diabetes  Have regular diabetes screenings. This checks your fasting blood sugar level. Have the screening done:  Once every three years after age 23 if you are at a normal weight and have a low risk for diabetes.  More often and at a younger age if you are overweight or have a high risk for diabetes.  What should I know about preventing infection?  Hepatitis B  If you have a higher risk for hepatitis B, you should be screened for this virus. Talk with your health care provider to find out if you are at risk for hepatitis B infection.  Hepatitis C  Blood testing is recommended for:  Everyone born from [birth year] through 1964.  Anyone  with known risk factors for hepatitis C.  Sexually transmitted infections (STIs)  You should be screened each year for STIs, including gonorrhea and chlamydia, if:  You are sexually active and are younger than 67 years of age.  You are older than 67 years of age and your health care provider tells you that you are at risk for this type of infection.  Your sexual activity has changed since you were last screened, and you are at increased risk for chlamydia or gonorrhea. Ask your health care provider if you are at risk.  Ask your health care provider about whether you are at high risk for HIV. Your health care provider  may recommend a prescription medicine to help prevent HIV infection. If you choose to take medicine to prevent HIV, you should first get tested for HIV. You should then be tested every 3 months for as long as you are taking the medicine.  Follow these instructions at home:  Alcohol use  Do not drink alcohol if your health care provider tells you not to drink.  If you drink alcohol:  Limit how much you have to 0-2 drinks a day.  Know how much alcohol is in your drink. In the [place] one drink equals one 12 oz bottle of beer (355 mL), one 5 oz glass of wine (148 mL), or one 1 oz glass of hard liquor (44 mL).  Lifestyle  Do not use any products that contain nicotine or tobacco. These products include cigarettes, chewing tobacco, and vaping devices, such as e-cigarettes. If you need help quitting, ask your health care provider.  Do not use street drugs.  Do not share needles.  Ask your health care provider for help if you need support or information about quitting drugs.  General instructions  Schedule regular health, dental, and eye exams.  Stay current with your vaccines.  Tell your health care provider if:  You often feel depressed.  You have ever been abused or do not feel safe at home.  Summary  Adopting a healthy lifestyle and getting preventive care are important in promoting health and wellness.  Follow your health care provider's instructions about healthy diet, exercising, and getting tested or screened for diseases.  Follow your health care provider's instructions on monitoring your cholesterol and blood pressure.  This information is not intended to replace advice given to you by your health care provider. Make sure you discuss any questions you have with your health care provider.  Document Revised: 12/30/2020 Document Reviewed: 12/30/2020  Elsevier Patient Education  2024 [employer].

## 2024-03-07 ENCOUNTER — Ambulatory Visit: Payer: Self-pay | Admitting: Internal Medicine

## 2024-03-07 LAB — PSA: PSA: 0.7 ng/mL (ref 0.10–4.00)

## 2024-03-07 LAB — TSH: TSH: 1.26 u[IU]/mL (ref 0.35–5.50)

## 2024-03-07 MED ORDER — ROSUVASTATIN CALCIUM 20 MG PO TABS: 20.0000 mg | ORAL_TABLET | Freq: Every day | ORAL | 0 refills | Status: DC

## 2024-03-07 MED ORDER — INDAPAMIDE 1.25 MG PO TABS: 1.2500 mg | ORAL_TABLET | Freq: Every day | ORAL | 0 refills | Status: DC

## 2024-03-08 MED ORDER — SHINGRIX 50 MCG/0.5ML IM SUSR
0.5000 mL | Freq: Once | INTRAMUSCULAR | 1 refills | Status: AC
Start: 2024-03-08 — End: 2024-03-08

## 2024-03-08 MED ORDER — TIOTROPIUM BROMIDE-OLODATEROL 2.5-2.5 MCG/ACT IN AERS: 2.0000 | INHALATION_SPRAY | Freq: Every day | RESPIRATORY_TRACT | 1 refills | Status: AC

## 2024-04-30 ENCOUNTER — Other Ambulatory Visit: Payer: Self-pay | Admitting: Cardiology

## 2024-04-30 DIAGNOSIS — E7800 Pure hypercholesterolemia, unspecified: Secondary | ICD-10-CM

## 2024-06-04 ENCOUNTER — Other Ambulatory Visit: Payer: Self-pay | Admitting: Internal Medicine

## 2024-06-04 DIAGNOSIS — E875 Hyperkalemia: Secondary | ICD-10-CM

## 2024-06-04 DIAGNOSIS — I10 Essential (primary) hypertension: Secondary | ICD-10-CM

## 2024-06-07 ENCOUNTER — Encounter: Payer: Self-pay | Admitting: Internal Medicine

## 2024-06-07 ENCOUNTER — Ambulatory Visit: Admitting: Internal Medicine

## 2024-06-07 VITALS — BP 136/84 | HR 96 | Temp 98.5°F | Resp 16 | Ht 71.5 in | Wt 159.2 lb

## 2024-06-07 DIAGNOSIS — I10 Essential (primary) hypertension: Secondary | ICD-10-CM | POA: Diagnosis not present

## 2024-06-07 DIAGNOSIS — Z23 Encounter for immunization: Secondary | ICD-10-CM

## 2024-06-07 DIAGNOSIS — E876 Hypokalemia: Secondary | ICD-10-CM | POA: Diagnosis not present

## 2024-06-07 DIAGNOSIS — T502X5A Adverse effect of carbonic-anhydrase inhibitors, benzothiadiazides and other diuretics, initial encounter: Secondary | ICD-10-CM | POA: Diagnosis not present

## 2024-06-07 DIAGNOSIS — E785 Hyperlipidemia, unspecified: Secondary | ICD-10-CM | POA: Diagnosis not present

## 2024-06-07 LAB — BASIC METABOLIC PANEL WITH GFR
BUN: 14 mg/dL (ref 6–23)
CO2: 32 meq/L (ref 19–32)
Calcium: 10 mg/dL (ref 8.4–10.5)
Chloride: 96 meq/L (ref 96–112)
Creatinine, Ser: 1.12 mg/dL (ref 0.40–1.50)
GFR: 67.99 mL/min (ref >60.00)
Glucose, Bld: 108 mg/dL — ABNORMAL HIGH (ref 70–99)
Potassium: 3.3 meq/L — ABNORMAL LOW (ref 3.5–5.1)
Sodium: 136 meq/L (ref 135–145)

## 2024-06-07 LAB — LIPID PANEL
Cholesterol: 142 mg/dL (ref 0–200)
HDL: 40.9 mg/dL (ref >39.00)
LDL Cholesterol: 49 mg/dL (ref 0–99)
NonHDL: 101.38
Total CHOL/HDL Ratio: 3
Triglycerides: 264 mg/dL — ABNORMAL HIGH (ref 0.0–149.0)
VLDL: 52.8 mg/dL — ABNORMAL HIGH (ref 0.0–40.0)

## 2024-06-07 LAB — CK: Total CK: 195 U/L (ref 17–232)

## 2024-06-07 MED ORDER — POTASSIUM CHLORIDE ER 10 MEQ PO TBCR: 10.0000 meq | EXTENDED_RELEASE_TABLET | Freq: Two times a day (BID) | ORAL | 1 refills | Status: AC

## 2024-06-07 MED ORDER — SHINGRIX 50 MCG/0.5ML IM SUSR: 0.5000 mL | Freq: Once | INTRAMUSCULAR | 1 refills | Status: AC

## 2024-06-07 NOTE — Progress Notes (Signed)
 Subjective:  Patient ID: Blake Ramirez, male    DOB: 1956-02-07  Age: 68 y.o. MRN: [id number]  CC: Hypertension, Hyperlipidemia, and COPD   HPI [name] presents for f/up ---  Discussed the use of AI scribe software for clinical note transcription with the patient, who gave verbal consent to proceed.  History of Present Illness Blake Ramirez is a 68 year old male who presents for follow-up regarding elevated potassium levels.  He has been managing elevated potassium levels with medication and currently has four or five pills left. He received a notification from [facility] for a refill and is unsure if he should proceed with it. He experiences no significant side effects from the medication, such as muscle cramps or spasms, although he mentions occasional shoulder discomfort, which he attributes to his sleeping position and a new pillow.  He maintains an active lifestyle, engaging in activities such as grass cutting and using a treadmill for about 20 minutes. He notes an improvement in his exertion capacity, as he can now complete tasks like mowing the lawn without needing breaks as frequently as before. No chest pain, shortness of breath, fatigue, or swelling in his legs or feet.  He has adjusted his diet by reducing oatmeal consumption, particularly avoiding those with high potassium content, and has eliminated eggs due to previous concerns about cholesterol levels. He monitors his blood pressure regularly, noting good control with recent readings around 136/84 mmHg.  No issues with bowel movements and is aware that it may be time for his Cologuard screening soon.     Outpatient Medications Prior to Visit  Medication Sig Dispense Refill   aspirin 81 MG chewable tablet Chew 81 mg by mouth daily.     cetirizine  (ZYRTEC ) 10 MG tablet Take 1 tablet (10 mg total) by mouth daily. 90 tablet 1   ezetimibe  (ZETIA ) 10 MG tablet TAKE 1 TABLET BY MOUTH EVERY DAY 90 tablet 0    indapamide  (LOZOL ) 1.25 MG tablet TAKE 1 TABLET BY MOUTH DAILY. 90 tablet 0   rosuvastatin  (CRESTOR ) 20 MG tablet Take 1 tablet (20 mg total) by mouth daily. 90 tablet 0   Tiotropium Bromide -Olodaterol 2.5-2.5 MCG/ACT AERS Inhale 2 puffs into the lungs daily. 12 g 1   No facility-administered medications prior to visit.    ROS Review of Systems  Constitutional:  Negative for appetite change, chills, diaphoresis, fatigue and fever.  HENT: Negative.    Respiratory: Negative.  Negative for cough, chest tightness, shortness of breath and wheezing.   Cardiovascular:  Negative for chest pain, palpitations and leg swelling.  Gastrointestinal: Negative.  Negative for abdominal pain, constipation, diarrhea, nausea and vomiting.  Genitourinary: Negative.  Negative for difficulty urinating.  Musculoskeletal: Negative.   Skin: Negative.   Neurological:  Negative for dizziness, weakness and headaches.  Hematological:  Negative for adenopathy. Does not bruise/bleed easily.  Psychiatric/Behavioral: Negative.      Objective:  BP 136/84 (BP Location: Left Arm, Patient Position: Sitting, Cuff Size: Normal)   Pulse 96   Temp 98.5 F (36.9 C) (Oral)   Resp 16   Ht 5' 11.5 (1.816 m)   Wt 159 lb 3.2 oz (72.2 kg)   SpO2 95%   BMI 21.89 kg/m   BP Readings from Last 3 Encounters:  06/07/24 136/84  03/06/24 128/86  11/08/23 138/78    Wt Readings from Last 3 Encounters:  06/07/24 159 lb 3.2 oz (72.2 kg)  03/06/24 158 lb 12.8 oz (72 kg)  11/08/23 159 lb 6.4  oz (72.3 kg)    Physical Exam Vitals reviewed.  Constitutional:      Appearance: Normal appearance.  HENT:     Nose: Nose normal.     Mouth/Throat:     Mouth: Mucous membranes are moist.  Eyes:     General: No scleral icterus.    Conjunctiva/sclera: Conjunctivae normal.  Cardiovascular:     Rate and Rhythm: Normal rate and regular rhythm.     Heart sounds: No murmur heard.    No friction rub. No gallop.  Pulmonary:     Effort:  Pulmonary effort is normal.     Breath sounds: No stridor. No wheezing, rhonchi or rales.  Abdominal:     General: Abdomen is flat.     Palpations: There is no mass.     Tenderness: There is no abdominal tenderness. There is no guarding.     Hernia: No hernia is present.  Musculoskeletal:        General: Normal range of motion.     Cervical back: Neck supple.     Right lower leg: No edema.     Left lower leg: No edema.  Lymphadenopathy:     Cervical: No cervical adenopathy.  Skin:    General: Skin is warm and dry.  Neurological:     General: No focal deficit present.     Mental Status: He is alert.  Psychiatric:        Mood and Affect: Mood normal.        Behavior: Behavior normal.     Lab Results  Component Value Date   WBC 5.8 03/06/2024   HGB 14.2 03/06/2024   HCT 42.2 03/06/2024   PLT 237.0 03/06/2024   GLUCOSE 108 (H) 06/07/2024   CHOL 142 06/07/2024   TRIG 264.0 (H) 06/07/2024   HDL 40.90 06/07/2024   LDLDIRECT 160.0 02/25/2022   LDLCALC 49 06/07/2024   ALT 19 03/06/2024   AST 20 03/06/2024   NA 136 06/07/2024   K 3.3 (L) 06/07/2024   CL 96 06/07/2024   CREATININE 1.12 06/07/2024   BUN 14 06/07/2024   CO2 32 06/07/2024   TSH 1.26 03/06/2024   PSA 0.70 03/06/2024    CT CHEST LUNG CA SCREEN LOW DOSE W/O CM Result Date: 07/06/2023 CLINICAL DATA:  68 year old male current smoker with 21 pack-year smoking history EXAM: CT CHEST WITHOUT CONTRAST LOW-DOSE FOR LUNG CANCER SCREENING TECHNIQUE: Multidetector CT imaging of the chest was performed following the standard protocol without IV contrast. RADIATION DOSE REDUCTION: This exam was performed according to the departmental dose-optimization program which includes automated exposure control, adjustment of the mA and/or kV according to patient size and/or use of iterative reconstruction technique. COMPARISON:  06/10/2022 screening chest CT.  05/12/2023 coronary CT FINDINGS: Cardiovascular: Normal heart size. No  significant pericardial effusion/thickening. Left anterior descending and right coronary atherosclerosis. Atherosclerotic nonaneurysmal thoracic aorta. Normal caliber pulmonary arteries. Mediastinum/Nodes: No significant thyroid  nodules. Unremarkable esophagus. No pathologically enlarged axillary, mediastinal or hilar lymph nodes, noting limited sensitivity for the detection of hilar adenopathy on this noncontrast study. Lungs/Pleura: No pneumothorax. No pleural effusion. Mild paraseptal and centrilobular emphysema with diffuse bronchial wall thickening. No acute consolidative airspace disease or lung masses. No significant pulmonary nodules. Upper abdomen: No acute abnormality. Musculoskeletal:  No aggressive appearing focal osseous lesions. IMPRESSION: 1. Lung-RADS 1, negative. Continue annual screening with low-dose chest CT without contrast in 12 months. 2. Two-vessel coronary atherosclerosis. 3. Aortic Atherosclerosis (ICD10-I70.0) and Emphysema (ICD10-J43.9). Electronically Signed   By:  Selinda DELENA Blue M.D.   On: 07/06/2023 17:38    Assessment & Plan:   Hyperlipidemia LDL goal <100- LDL goal achieved. Doing well on the statin  -     Lipid panel; Future -     CK; Future  Primary hypertension- BP is well controlled. I will treat the low K+. -     Basic metabolic panel with GFR; Future -     Potassium Chloride ER; Take 1 tablet (10 mEq total) by mouth 2 (two) times daily.  Dispense: 180 tablet; Refill: 1  Need for prophylactic vaccination and inoculation against varicella -     Shingrix ; Inject 0.5 mLs into the muscle once for 1 dose.  Dispense: 0.5 mL; Refill: 1  Diuretic-induced hypokalemia -     Potassium Chloride ER; Take 1 tablet (10 mEq total) by mouth 2 (two) times daily.  Dispense: 180 tablet; Refill: 1     Follow-up: Return in about 6 months (around 12/06/2024).  Debby Molt, MD

## 2024-06-07 NOTE — Patient Instructions (Signed)
 Hypertension, Adult High blood pressure (hypertension) is when the force of blood pumping through the arteries is too strong. The arteries are the blood vessels that carry blood from the heart throughout the body. Hypertension forces the heart to work harder to pump blood and may cause arteries to become narrow or stiff. Untreated or uncontrolled hypertension can lead to a heart attack, heart failure, a stroke, kidney disease, and other problems. A blood pressure reading consists of a higher number over a lower number. Ideally, your blood pressure should be below 120/80. The first ("top") number is called the systolic pressure. It is a measure of the pressure in your arteries as your heart beats. The second ("bottom") number is called the diastolic pressure. It is a measure of the pressure in your arteries as the heart relaxes. What are the causes? The exact cause of this condition is not known. There are some conditions that result in high blood pressure. What increases the risk? Certain factors may make you more likely to develop high blood pressure. Some of these risk factors are under your control, including: Smoking. Not getting enough exercise or physical activity. Being overweight. Having too much fat, sugar, calories, or salt (sodium) in your diet. Drinking too much alcohol. Other risk factors include: Having a personal history of heart disease, diabetes, high cholesterol, or kidney disease. Stress. Having a family history of high blood pressure and high cholesterol. Having obstructive sleep apnea. Age. The risk increases with age. What are the signs or symptoms? High blood pressure may not cause symptoms. Very high blood pressure (hypertensive crisis) may cause: Headache. Fast or irregular heartbeats (palpitations). Shortness of breath. Nosebleed. Nausea and vomiting. Vision changes. Severe chest pain, dizziness, and seizures. How is this diagnosed? This condition is diagnosed by  measuring your blood pressure while you are seated, with your arm resting on a flat surface, your legs uncrossed, and your feet flat on the floor. The cuff of the blood pressure monitor will be placed directly against the skin of your upper arm at the level of your heart. Blood pressure should be measured at least twice using the same arm. Certain conditions can cause a difference in blood pressure between your right and left arms. If you have a high blood pressure reading during one visit or you have normal blood pressure with other risk factors, you may be asked to: Return on a different day to have your blood pressure checked again. Monitor your blood pressure at home for 1 week or longer. If you are diagnosed with hypertension, you may have other blood or imaging tests to help your health care provider understand your overall risk for other conditions. How is this treated? This condition is treated by making healthy lifestyle changes, such as eating healthy foods, exercising more, and reducing your alcohol intake. You may be referred for counseling on a healthy diet and physical activity. Your health care provider may prescribe medicine if lifestyle changes are not enough to get your blood pressure under control and if: Your systolic blood pressure is above 130. Your diastolic blood pressure is above 80. Your personal target blood pressure may vary depending on your medical conditions, your age, and other factors. Follow these instructions at home: Eating and drinking  Eat a diet that is high in fiber and potassium, and low in sodium, added sugar, and fat. An example of this eating plan is called the DASH diet. DASH stands for Dietary Approaches to Stop Hypertension. To eat this way: Eat  plenty of fresh fruits and vegetables. Try to fill one half of your plate at each meal with fruits and vegetables. Eat whole grains, such as whole-wheat pasta, brown rice, or whole-grain bread. Fill about one  fourth of your plate with whole grains. Eat or drink low-fat dairy products, such as skim milk or low-fat yogurt. Avoid fatty cuts of meat, processed or cured meats, and poultry with skin. Fill about one fourth of your plate with lean proteins, such as fish, chicken without skin, beans, eggs, or tofu. Avoid pre-made and processed foods. These tend to be higher in sodium, added sugar, and fat. Reduce your daily sodium intake. Many people with hypertension should eat less than 1,500 mg of sodium a day. Do not drink alcohol if: Your health care provider tells you not to drink. You are pregnant, may be pregnant, or are planning to become pregnant. If you drink alcohol: Limit how much you have to: 0-1 drink a day for women. 0-2 drinks a day for men. Know how much alcohol is in your drink. In the [place] one drink equals one 12 oz bottle of beer (355 mL), one 5 oz glass of wine (148 mL), or one 1 oz glass of hard liquor (44 mL). Lifestyle  Work with your health care provider to maintain a healthy body weight or to lose weight. Ask what an ideal weight is for you. Get at least 30 minutes of exercise that causes your heart to beat faster (aerobic exercise) most days of the week. Activities may include walking, swimming, or biking. Include exercise to strengthen your muscles (resistance exercise), such as Pilates or lifting weights, as part of your weekly exercise routine. Try to do these types of exercises for 30 minutes at least 3 days a week. Do not use any products that contain nicotine or tobacco. These products include cigarettes, chewing tobacco, and vaping devices, such as e-cigarettes. If you need help quitting, ask your health care provider. Monitor your blood pressure at home as told by your health care provider. Keep all follow-up visits. This is important. Medicines Take over-the-counter and prescription medicines only as told by your health care provider. Follow directions carefully. Blood  pressure medicines must be taken as prescribed. Do not skip doses of blood pressure medicine. Doing this puts you at risk for problems and can make the medicine less effective. Ask your health care provider about side effects or reactions to medicines that you should watch for. Contact a health care provider if you: Think you are having a reaction to a medicine you are taking. Have headaches that keep coming back (recurring). Feel dizzy. Have swelling in your ankles. Have trouble with your vision. Get help right away if you: Develop a severe headache or confusion. Have unusual weakness or numbness. Feel faint. Have severe pain in your chest or abdomen. Vomit repeatedly. Have trouble breathing. These symptoms may be an emergency. Get help right away. Call 911. Do not wait to see if the symptoms will go away. Do not drive yourself to the hospital. Summary Hypertension is when the force of blood pumping through your arteries is too strong. If this condition is not controlled, it may put you at risk for serious complications. Your personal target blood pressure may vary depending on your medical conditions, your age, and other factors. For most people, a normal blood pressure is less than 120/80. Hypertension is treated with lifestyle changes, medicines, or a combination of both. Lifestyle changes include losing weight, eating a healthy,  low-sodium diet, exercising more, and limiting alcohol. This information is not intended to replace advice given to you by your health care provider. Make sure you discuss any questions you have with your health care provider. Document Revised: 06/17/2021 Document Reviewed: 06/17/2021 Elsevier Patient Education  2024 [employer].

## 2024-06-08 ENCOUNTER — Ambulatory Visit: Payer: Self-pay | Admitting: Internal Medicine

## 2024-07-08 ENCOUNTER — Other Ambulatory Visit: Payer: Self-pay | Admitting: Internal Medicine

## 2024-07-08 DIAGNOSIS — E785 Hyperlipidemia, unspecified: Secondary | ICD-10-CM

## 2024-07-26 ENCOUNTER — Other Ambulatory Visit: Payer: Self-pay | Admitting: Cardiology

## 2024-07-26 DIAGNOSIS — E7800 Pure hypercholesterolemia, unspecified: Secondary | ICD-10-CM

## 2024-08-19 ENCOUNTER — Other Ambulatory Visit: Payer: Self-pay | Admitting: Cardiology

## 2024-08-19 DIAGNOSIS — E7800 Pure hypercholesterolemia, unspecified: Secondary | ICD-10-CM

## 2024-08-23 ENCOUNTER — Other Ambulatory Visit: Payer: Self-pay | Admitting: Cardiology

## 2024-08-23 DIAGNOSIS — E7800 Pure hypercholesterolemia, unspecified: Secondary | ICD-10-CM

## 2024-09-05 ENCOUNTER — Other Ambulatory Visit: Payer: Self-pay | Admitting: Cardiology

## 2024-09-05 DIAGNOSIS — E7800 Pure hypercholesterolemia, unspecified: Secondary | ICD-10-CM

## 2024-09-06 ENCOUNTER — Ambulatory Visit: Admitting: Internal Medicine

## 2024-11-08 ENCOUNTER — Ambulatory Visit

## 2024-12-06 ENCOUNTER — Ambulatory Visit: Admitting: Internal Medicine
